# Patient Record
Sex: Female | Born: 1970 | Race: Black or African American | Hispanic: No | Marital: Single | Smoking: Current every day smoker
Health system: Southern US, Community
[De-identification: ages and names within clinical notes are randomized; demographics above are authoritative.]

## PROBLEM LIST (undated history)

## (undated) DIAGNOSIS — F329 Major depressive disorder, single episode, unspecified: Secondary | ICD-10-CM

## (undated) DIAGNOSIS — F32A Depression, unspecified: Secondary | ICD-10-CM

## (undated) DIAGNOSIS — R011 Cardiac murmur, unspecified: Secondary | ICD-10-CM

## (undated) DIAGNOSIS — J45909 Unspecified asthma, uncomplicated: Secondary | ICD-10-CM

## (undated) DIAGNOSIS — J449 Chronic obstructive pulmonary disease, unspecified: Secondary | ICD-10-CM

## (undated) HISTORY — DX: Depression, unspecified: F32.A

## (undated) HISTORY — PX: ABDOMINAL HYSTERECTOMY: SHX81

## (undated) HISTORY — DX: Major depressive disorder, single episode, unspecified: F32.9

---

## 2004-08-31 ENCOUNTER — Emergency Department: Payer: Self-pay | Admitting: Emergency Medicine

## 2005-07-08 ENCOUNTER — Emergency Department: Payer: Self-pay | Admitting: Emergency Medicine

## 2006-07-25 ENCOUNTER — Inpatient Hospital Stay: Payer: Self-pay | Admitting: Psychiatry

## 2006-10-02 ENCOUNTER — Ambulatory Visit: Payer: Self-pay | Admitting: Unknown Physician Specialty

## 2007-02-27 ENCOUNTER — Ambulatory Visit: Payer: Self-pay | Admitting: Unknown Physician Specialty

## 2007-03-28 ENCOUNTER — Ambulatory Visit: Payer: Self-pay | Admitting: Unknown Physician Specialty

## 2007-09-10 ENCOUNTER — Emergency Department: Payer: Self-pay | Admitting: Emergency Medicine

## 2007-12-03 ENCOUNTER — Emergency Department: Payer: Self-pay | Admitting: Emergency Medicine

## 2008-09-12 ENCOUNTER — Emergency Department: Payer: Self-pay | Admitting: Emergency Medicine

## 2009-10-28 ENCOUNTER — Emergency Department: Payer: Self-pay | Admitting: Emergency Medicine

## 2010-08-04 ENCOUNTER — Emergency Department: Payer: Self-pay | Admitting: Emergency Medicine

## 2010-09-04 ENCOUNTER — Emergency Department: Payer: Self-pay | Admitting: Unknown Physician Specialty

## 2011-01-18 ENCOUNTER — Ambulatory Visit: Payer: Self-pay | Admitting: Internal Medicine

## 2011-01-31 ENCOUNTER — Ambulatory Visit: Payer: Self-pay | Admitting: Internal Medicine

## 2012-03-21 ENCOUNTER — Ambulatory Visit: Payer: Self-pay | Admitting: Internal Medicine

## 2013-06-02 ENCOUNTER — Ambulatory Visit: Payer: Self-pay | Admitting: Physician Assistant

## 2014-04-02 ENCOUNTER — Ambulatory Visit: Payer: Self-pay

## 2014-08-12 ENCOUNTER — Ambulatory Visit: Payer: Self-pay | Admitting: Physician Assistant

## 2014-08-27 ENCOUNTER — Ambulatory Visit: Payer: Self-pay | Admitting: Internal Medicine

## 2016-03-14 ENCOUNTER — Encounter: Payer: Self-pay | Admitting: Emergency Medicine

## 2016-03-14 ENCOUNTER — Emergency Department
Admission: EM | Admit: 2016-03-14 | Discharge: 2016-03-14 | Disposition: A | Discharge status: Home / Self Care | Payer: Medicaid Other | Attending: Emergency Medicine | Admitting: Emergency Medicine

## 2016-03-14 DIAGNOSIS — I809 Phlebitis and thrombophlebitis of unspecified site: Secondary | ICD-10-CM | POA: Diagnosis not present

## 2016-03-14 DIAGNOSIS — I10 Essential (primary) hypertension: Secondary | ICD-10-CM | POA: Diagnosis present

## 2016-03-14 DIAGNOSIS — J449 Chronic obstructive pulmonary disease, unspecified: Secondary | ICD-10-CM | POA: Diagnosis not present

## 2016-03-14 DIAGNOSIS — J45909 Unspecified asthma, uncomplicated: Secondary | ICD-10-CM | POA: Insufficient documentation

## 2016-03-14 DIAGNOSIS — F17210 Nicotine dependence, cigarettes, uncomplicated: Secondary | ICD-10-CM | POA: Diagnosis not present

## 2016-03-14 DIAGNOSIS — Z711 Person with feared health complaint in whom no diagnosis is made: Secondary | ICD-10-CM

## 2016-03-14 HISTORY — DX: Chronic obstructive pulmonary disease, unspecified: J44.9

## 2016-03-14 HISTORY — DX: Unspecified asthma, uncomplicated: J45.909

## 2016-03-14 MED ORDER — NAPROXEN 500 MG PO TABS: 500.0000 mg | ORAL_TABLET | Freq: Two times a day (BID) | ORAL | Status: DC

## 2016-03-14 NOTE — Discharge Instructions (Signed)
Phlebitis Phlebitis is soreness and puffiness (swelling) in a vein.  HOME CARE  Only take medicine as told by your doctor.  Raise (elevate) the affected limb on a pillow as told by your doctor.  Keep a warm pack on the affected vein as told by your doctor. Do not sleep with a heating pad.  Use special stockings or bandages around the area of the affected vein as told by your doctor. These will speed healing and keep the condition from coming back.  Talk to your doctor about all the medicines you take.  Get follow-up blood tests as told by your doctor.  If the phlebitis is in your legs:  Avoid standing or resting for long periods.  Keep your legs moving. Raise your legs when you sit or lie.  Do not smoke.  Follow-up with your doctor as told. GET HELP IF:  You have strange bruises or bleeding.  Your puffiness or pain in the affected area is not getting better.  You are taking medicine to lessen puffiness (anti-inflammatory medicine), and you get belly pain.  You have a fever. GET HELP RIGHT AWAY IF:   The phlebitis gets worse and you have more pain, puffiness (swelling), or redness.  You have trouble breathing or have chest pain. MAKE SURE YOU:   Understand these instructions.  Will watch your condition.  Will get help right away if you are not doing well or get worse.   This information is not intended to replace advice given to you by your health care provider. Make sure you discuss any questions you have with your health care provider.   Document Released: 10/17/2009 Document Revised: 11/03/2013 Document Reviewed: 07/06/2013 Elsevier Interactive Patient Education 2016 [employer].   How to Take Your Blood Pressure  HOW DO I GET A BLOOD PRESSURE MACHINE?  You can buy an electronic home blood pressure machine at your local pharmacy. Insurance will sometimes cover the cost if you have a prescription.  Ask your doctor what type of machine is best for you. There  are different machines for your arm and your wrist.  If you decide to buy a machine to check your blood pressure on your arm, first check the size of your arm so you can buy the right size cuff. To check the size of your arm:  Use a measuring tape that shows both inches and centimeters.  Wrap the measuring tape around the upper-middle part of your arm. You may need someone to help you measure.  Write down your arm measurement in both inches and centimeters.  To measure your blood pressure correctly, it is important to have the right size cuff.  If your arm is up to 13 inches (up to 34 centimeters), get an adult cuff size.  If your arm is 13 to 17 inches (35 to 44 centimeters), get a large adult cuff size.    If your arm is 17 to 20 inches (45 to 52 centimeters), get an adult thigh cuff.  WHAT DO THE NUMBERS MEAN?  There are two numbers that make up your blood pressure. For example: 120/80.  The first number (120 in our example) is called the "systolic pressure." It is a measure of the pressure in your blood vessels when your heart is pumping blood.  The second number (80 in our example) is called the "diastolic pressure." It is a measure of the pressure in your blood vessels when your heart is resting between beats. Your doctor will tell you  what your blood pressure should be. WHAT SHOULD I DO BEFORE I CHECK MY BLOOD PRESSURE?  Try to rest or relax for at least 30 minutes before you check your blood pressure.  Do not smoke.  Do not have any drinks with caffeine, such as:  Soda.  Coffee.  Tea. Check your blood pressure in a quiet room.  Sit down and stretch out your arm on a table. Keep your arm at about the level of your heart. Let your arm relax.  Make sure that your legs are not crossed. HOW DO I CHECK MY BLOOD PRESSURE?  Follow the directions that came with your machine.  Make sure you remove any tight-fitting clothing from your arm or wrist. Wrap the cuff around your upper arm or  wrist. You should be able to fit a finger between the cuff and your arm. If you cannot fit a finger between the cuff and your arm, it is too tight and should be removed and rewrapped.  Some units require you to manually pump up the arm cuff.  Automatic units inflate the cuff when you press a button.  Cuff deflation is automatic in both models.  After the cuff is inflated, the unit measures your blood pressure and pulse. The readings are shown on a monitor. Hold still and breathe normally while the cuff is inflated.  Getting a reading takes less than a minute.  Some models store readings in a memory. Some provide a printout of readings. If your machine does not store your readings, keep a written record.  Take readings with you to your next visit with your doctor. This information is not intended to replace advice given to you by your health care provider. Make sure you discuss any questions you have with your health care provider.  Document Released: 10/11/2008 Document Revised: 11/19/2014 Document Reviewed: 12/24/2013  Elsevier Interactive Patient Education [employer]2016 Elsevier Inc.

## 2016-03-14 NOTE — ED Notes (Signed)
See triage note.  States she is worried about her b/p  Her friend tok her pressure and told her it was high  B/p on arrival to ED was normal  Denies any sx's

## 2016-03-14 NOTE — ED Notes (Signed)
Patient presents to the ED concerned about high blood pressure.  Patient states 2 weeks ago she went to the plasma center to sell plasma and her blood pressure was low, a week ago it was low again.  Today patient had her neighbor check her blood pressure and her neighbor told patient her blood pressure was, "a little above normal."  Patient states, "I don't know what's going on."  Patient denies any pain, blurred vision, dizziness or weakness.  Patient is alert and oriented x 4 at this time.

## 2016-03-14 NOTE — ED Provider Notes (Signed)
[facility] Emergency Department Provider Note  ____________________________________________  Time seen: Approximately 3:15 PM  I have reviewed the triage vital signs and the nursing notes.   HISTORY  Chief Complaint Hypertension    HPI Angel Juarez is a 45 y.o. female , NAD, presents to the emergency department to have her blood pressure checked. Patient states that she has been giving blood to the plasma center twice per week over the last 6 weeks. States that she was told that her blood pressure was low during one of her visits to the plasma center. States that she bought a manual blood pressure cuff and had a friend check her blood pressure but unfortunately they did not use a stethoscope to assess the pressure. Patient notes that her neighbor told her that her blood pressure was "a little above normal". Patient denies any chest pain, shortness of breath, cough, abdominal pain, nausea, vomiting, diaphoresis, visual changes, numbness, weakness, tingling. On beside note she relays that she has some mild pain in the right antecubital fossa that can radiate up the arm. Does not had any oozing or weeping from the site. No fevers, chills, body aches.   Past Medical History  Diagnosis Date  . Asthma   . COPD (chronic obstructive pulmonary disease) (HCC)     There are no active problems to display for this patient.   Past Surgical History  Procedure Laterality Date  . Abdominal hysterectomy      Current Outpatient Rx  Name  Route  Sig  Dispense  Refill  . naproxen (NAPROSYN) 500 MG tablet   Oral   Take 1 tablet (500 mg total) by mouth 2 (two) times daily with a meal.   14 tablet   0     Allergies Shellfish allergy and Amoxicillin  No family history on file.  Social History Social History  Substance Use Topics  . Smoking status: Current Every Day Smoker -- 0.50 packs/day    Types: Cigarettes  . Smokeless tobacco: None  . Alcohol Use: Yes   Comment: weekly     Review of Systems  Constitutional: No fever/chills, fatigue Eyes: No visual changes.  Cardiovascular: No chest pain, Palpitations. Respiratory: No cough. No shortness of breath. No wheezing.  Gastrointestinal: No abdominal pain.  No nausea, vomiting.  Musculoskeletal: Negative for back, neck pain.  Skin: Positive redness and swelling left antecubital fossa. Negative for rash. Neurological: Negative for headaches, focal weakness or numbness. No tingling. 10-point ROS otherwise negative.  ____________________________________________   PHYSICAL EXAM:  VITAL SIGNS: ED Triage Vitals  Enc Vitals Group     BP 03/14/16 1452 103/67 mmHg     Pulse Rate 03/14/16 1452 88     Resp 03/14/16 1452 18     Temp 03/14/16 1452 98.2 F (36.8 C)     Temp Source 03/14/16 1452 Oral     SpO2 03/14/16 1452 98 %     Weight 03/14/16 1452 123 lb (55.792 kg)     Height 03/14/16 1452  (1.6 m)     Head Cir --      Peak Flow --      Pain Score 03/14/16 1453 0     Pain Loc --      Pain Edu? --      Excl. in GC? --      Constitutional: Alert and oriented. Well appearing and in no acute distress. Eyes: Conjunctivae are normal. PERRL. EOMI without pain.  Head: Atraumatic. Neck: Supple with full  range of motion Hematological/Lymphatic/Immunilogical: No cervical lymphadenopathy. Cardiovascular: Normal rate, regular rhythm. Normal S1 and S2.  Good peripheral circulation with 2+ pulses noted in the bilateral upper extremities. Capillary refill less than 3 seconds in the right fingers. Respiratory: Normal respiratory effort without tachypnea or retractions. Lungs CTAB with breath sounds noted in all lung fields. Musculoskeletal: Full range of motion of the right shoulder, elbow, wrist, hand, fingers without pain. No lower extremity tenderness nor edema.  No joint effusions. Neurologic:  Normal speech and language. No gross focal neurologic deficits are appreciated.  Skin:  Single  puncture wound with surrounding ecchymosis noted about the right antecubital fossa with evidence of erythema, warmth following a blood vessel of the right upper arm. No area of induration or fluctuance is noted. No oozing or weeping of any site. Skin is warm, dry and intact. No rash noted. Psychiatric: Mood and affect are normal. Speech and behavior are normal. Patient exhibits appropriate insight and judgement.   ____________________________________________   LABS  None ____________________________________________  EKG  None ____________________________________________  RADIOLOGY  None ____________________________________________    PROCEDURES  Procedure(s) performed: None    Medications - No data to display   ____________________________________________   INITIAL IMPRESSION / ASSESSMENT AND PLAN / ED COURSE  Patient's diagnosis is consistent with phlebitis and person with her complaint without diagnosis. Patient will be discharged home with prescriptions for naproxen to take as directed. Patient is to apply warm heat per exit care instructions given to treat the phlebitis. Patient is advised that she should not give plasma for at least 2 weeks to allow her body to heal. Patient may follow up with her primary care provider for repeat blood pressure check as needed. Patient also advised that she can go to any local pharmacy or [facility] to have her blood pressure recorded by a medical professional. Patient was reassured at this time blood pressure reading along with all other vital signs were within normal limits. Patient is given ED precautions to return to the ED for any worsening or new symptoms.    ____________________________________________  FINAL CLINICAL IMPRESSION(S) / ED DIAGNOSES  Final diagnoses:  Phlebitis  Person with feared complaint in whom no diagnosis was made      NEW MEDICATIONS STARTED DURING THIS VISIT:  Discharge Medication List as of  03/14/2016  3:55 PM    START taking these medications   Details  naproxen (NAPROSYN) 500 MG tablet Take 1 tablet (500 mg total) by mouth 2 (two) times daily with a meal., Starting 03/14/2016, Until Discontinued, Print             Hope PigeonJami L Keishawn Rajewski, PA-C 03/14/16 1630  Phineas SemenGraydon Goodman, MD 03/14/16 1721

## 2017-05-29 ENCOUNTER — Other Ambulatory Visit: Payer: Self-pay | Admitting: Internal Medicine

## 2017-05-29 DIAGNOSIS — R0602 Shortness of breath: Secondary | ICD-10-CM

## 2017-11-11 ENCOUNTER — Emergency Department: Payer: Medicaid Other

## 2017-11-11 ENCOUNTER — Encounter: Payer: Self-pay | Admitting: Emergency Medicine

## 2017-11-11 ENCOUNTER — Emergency Department
Admission: EM | Admit: 2017-11-11 | Discharge: 2017-11-11 | Disposition: A | Discharge status: Home / Self Care | Payer: Medicaid Other | Attending: Emergency Medicine | Admitting: Emergency Medicine

## 2017-11-11 DIAGNOSIS — J449 Chronic obstructive pulmonary disease, unspecified: Secondary | ICD-10-CM | POA: Diagnosis not present

## 2017-11-11 DIAGNOSIS — F17210 Nicotine dependence, cigarettes, uncomplicated: Secondary | ICD-10-CM | POA: Insufficient documentation

## 2017-11-11 DIAGNOSIS — R112 Nausea with vomiting, unspecified: Secondary | ICD-10-CM | POA: Diagnosis not present

## 2017-11-11 DIAGNOSIS — J45909 Unspecified asthma, uncomplicated: Secondary | ICD-10-CM | POA: Diagnosis not present

## 2017-11-11 DIAGNOSIS — Z79899 Other long term (current) drug therapy: Secondary | ICD-10-CM | POA: Diagnosis not present

## 2017-11-11 DIAGNOSIS — R1110 Vomiting, unspecified: Secondary | ICD-10-CM | POA: Diagnosis present

## 2017-11-11 LAB — CBC
HEMATOCRIT: 40 % (ref 35.0–47.0)
Hemoglobin: 13.5 g/dL (ref 12.0–16.0)
MCH: 32.8 pg (ref 26.0–34.0)
MCHC: 33.8 g/dL (ref 32.0–36.0)
MCV: 97 fL (ref 80.0–100.0)
Platelets: 203 10*3/uL (ref 150–440)
RBC: 4.12 MIL/uL (ref 3.80–5.20)
RDW: 11.9 % (ref 11.5–14.5)
WBC: 7.3 10*3/uL (ref 3.6–11.0)

## 2017-11-11 LAB — URINALYSIS, COMPLETE (UACMP) WITH MICROSCOPIC
BACTERIA UA: NONE SEEN
BILIRUBIN URINE: NEGATIVE
Glucose, UA: NEGATIVE mg/dL
Hgb urine dipstick: NEGATIVE
KETONES UR: NEGATIVE mg/dL
LEUKOCYTES UA: NEGATIVE
Nitrite: NEGATIVE
PROTEIN: NEGATIVE mg/dL
Specific Gravity, Urine: 1.024 (ref 1.005–1.030)
pH: 6 (ref 5.0–8.0)

## 2017-11-11 LAB — COMPREHENSIVE METABOLIC PANEL
ALT: 16 U/L (ref 14–54)
AST: 17 U/L (ref 15–41)
Albumin: 4.1 g/dL (ref 3.5–5.0)
Alkaline Phosphatase: 65 U/L (ref 38–126)
Anion gap: 7 (ref 5–15)
BUN: 9 mg/dL (ref 6–20)
CHLORIDE: 105 mmol/L (ref 101–111)
CO2: 28 mmol/L (ref 22–32)
Calcium: 8.8 mg/dL — ABNORMAL LOW (ref 8.9–10.3)
Creatinine, Ser: 0.78 mg/dL (ref 0.44–1.00)
Glucose, Bld: 97 mg/dL (ref 65–99)
POTASSIUM: 3.4 mmol/L — AB (ref 3.5–5.1)
SODIUM: 140 mmol/L (ref 135–145)
Total Bilirubin: 0.7 mg/dL (ref 0.3–1.2)
Total Protein: 6.6 g/dL (ref 6.5–8.1)

## 2017-11-11 LAB — LIPASE, BLOOD: LIPASE: 41 U/L (ref 11–51)

## 2017-11-11 MED ORDER — ONDANSETRON HCL 4 MG PO TABS
4.0000 mg | ORAL_TABLET | Freq: Once | ORAL | Status: AC
  Administered 2017-11-11: 4 mg via ORAL
  Filled 2017-11-11: qty 1

## 2017-11-11 MED ORDER — ACETAMINOPHEN 325 MG PO TABS
650.0000 mg | ORAL_TABLET | Freq: Once | ORAL | Status: AC
  Administered 2017-11-11: 650 mg via ORAL
  Filled 2017-11-11: qty 2

## 2017-11-11 MED ORDER — ONDANSETRON HCL 4 MG PO TABS: 4.0000 mg | ORAL_TABLET | Freq: Every day | ORAL | 0 refills | Status: DC | PRN

## 2017-11-11 MED ORDER — ONDANSETRON HCL 4 MG PO TABS: 4.0000 mg | ORAL_TABLET | Freq: Every day | ORAL | 0 refills | Status: AC | PRN

## 2017-11-11 NOTE — ED Provider Notes (Signed)
[facility] Emergency Department Provider Note  ____________________________________________  Time seen: Approximately 10:07 PM  I have reviewed the triage vital signs and the nursing notes.   HISTORY  Chief Complaint Emesis    HPI Angel Juarez is a 46 y.o. female that presents to the emergency department for evaluation of vomiting for 4 days.  Patient states that she is able to drink liquids but is unable to keep down any solids. She is having some overall abdominal discomfort and low back pain.  She did eat a subway sandwhich on Saturday and chicken noodle soup today. She states that she made a homemade alcoholic punch on Wednesday with old fruit and has not been able to eat since.  Last bowel movement was yesterday and was normal.  Patient has sexual intercourse with one person and they use protection.  Patient had a hysterectomy greater than 10 years ago.  She denies nausea, dysuria, urgency, frequency, diarrhea, constipation, vaginal discharge.  Past Medical History:  Diagnosis Date  . Asthma   . COPD (chronic obstructive pulmonary disease) (HCC)     There are no active problems to display for this patient.   Past Surgical History:  Procedure Laterality Date  . ABDOMINAL HYSTERECTOMY      Prior to Admission medications   Medication Sig Start Date End Date Taking? Authorizing Provider  naproxen (NAPROSYN) 500 MG tablet Take 1 tablet (500 mg total) by mouth 2 (two) times daily with a meal. 03/14/16   Hagler, Jami L, PA-C  ondansetron (ZOFRAN) 4 MG tablet Take 1 tablet (4 mg total) by mouth daily as needed for nausea or vomiting. 11/11/17 11/11/18  Enid DerryWagner, Donovin Kraemer, PA-C    Allergies Shellfish allergy and Amoxicillin  History reviewed. No pertinent family history.  Social History Social History   Tobacco Use  . Smoking status: Current Every Day Smoker    Packs/day: 0.50    Types: Cigarettes  . Smokeless tobacco: Never Used  Substance Use Topics   . Alcohol use: Yes    Comment: weekly  . Drug use: Not on file     Review of Systems  Constitutional: No fever/chills Cardiovascular: No chest pain. Respiratory: No SOB. Genitourinary: Negative for dysuria. Musculoskeletal: Positive for back pain. Skin: Negative for rash, abrasions, lacerations, ecchymosis.   ____________________________________________   PHYSICAL EXAM:  VITAL SIGNS: ED Triage Vitals  Enc Vitals Group     BP 11/11/17 1917 99/70     Pulse Rate 11/11/17 1917 69     Resp 11/11/17 1917 16     Temp 11/11/17 1917 98.2 F (36.8 C)     Temp Source 11/11/17 1917 Oral     SpO2 11/11/17 1917 98 %     Weight 11/11/17 1916 123 lb (55.8 kg)     Height --      Head Circumference --      Peak Flow --      Pain Score 11/11/17 2111 7     Pain Loc --      Pain Edu? --      Excl. in GC? --      Constitutional: Alert and oriented. Well appearing and in no acute distress. Eyes: Conjunctivae are normal. PERRL. EOMI. Head: Atraumatic. ENT:      Ears:      Nose: No congestion/rhinnorhea.      Mouth/Throat: Mucous membranes are moist.  Neck: No stridor.   Cardiovascular: Normal rate, regular rhythm.  Good peripheral circulation. Respiratory: Normal respiratory effort without tachypnea  or retractions. Lungs CTAB. Good air entry to the bases with no decreased or absent breath sounds. Gastrointestinal: Bowel sounds 4 quadrants. Soft and nontender to palpation. No guarding or rigidity. No palpable masses. No distention.  Musculoskeletal: Full range of motion to all extremities. No gross deformities appreciated. Neurologic:  Normal speech and language. No gross focal neurologic deficits are appreciated.  Skin:  Skin is warm, dry and intact. No rash noted.   ____________________________________________   LABS (all labs ordered are listed, but only abnormal results are displayed)  Labs Reviewed  COMPREHENSIVE METABOLIC PANEL - Abnormal; Notable for the following  components:      Result Value   Potassium 3.4 (*)    Calcium 8.8 (*)    All other components within normal limits  URINALYSIS, COMPLETE (UACMP) WITH MICROSCOPIC - Abnormal; Notable for the following components:   Color, Urine YELLOW (*)    APPearance CLEAR (*)    Squamous Epithelial / LPF 0-5 (*)    All other components within normal limits  LIPASE, BLOOD  CBC   ____________________________________________  EKG   ____________________________________________  RADIOLOGY Lexine Baton, personally viewed and evaluated these images (plain radiographs) as part of my medical decision making, as well as reviewing the written report by the radiologist.  Dg Abdomen 1 View  Result Date: 11/11/2017 CLINICAL DATA:  Acute onset of vomiting. EXAM: ABDOMEN - 1 VIEW COMPARISON:  CT of the abdomen and pelvis from 12/04/2007 FINDINGS: The visualized bowel gas pattern is unremarkable. Scattered air and stool filled loops of colon are seen; no abnormal dilatation of small bowel loops is seen to suggest small bowel obstruction. No free intra-abdominal air is identified, though evaluation for free air is limited on a single supine view. The visualized osseous structures are within normal limits; the sacroiliac joints are unremarkable in appearance. IMPRESSION: Unremarkable bowel gas pattern; no free intra-abdominal air seen. Small to moderate amount of stool noted in the colon. Electronically Signed   By: Roanna Raider M.D.   On: 11/11/2017 22:25    ____________________________________________    PROCEDURES  Procedure(s) performed:    Procedures    Medications  ondansetron [employer]) tablet 4 mg (4 mg Oral Given 11/11/17 2201)  acetaminophen (TYLENOL) tablet 650 mg (650 mg Oral Given 11/11/17 2310)     ____________________________________________   INITIAL IMPRESSION / ASSESSMENT AND PLAN / ED COURSE  Pertinent labs & imaging results that were available during my care of the patient  were reviewed by me and considered in my medical decision making (see chart for details).  Review of the [place] CSRS was performed in accordance of the [facility] prior to dispensing any controlled drugs.   Patient presented to the emergency department for evaluation of vomiting for 4 days.  Vital signs, labwork and exam are reassuring.  X-ray was ordered to evaluate for obstruction or volvulus and negative for acute abnormalities.  Patient is eating graham crackers with peanut butter and drinking ginger ale in room without difficulty.  Patient will be discharged home with prescriptions for Zofran. Patient is to follow up with PCP as directed. Patient is given ED precautions to return to the ED for any worsening or new symptoms.     ____________________________________________  FINAL CLINICAL IMPRESSION(S) / ED DIAGNOSES  Final diagnoses:  Non-intractable vomiting with nausea, unspecified vomiting type      NEW MEDICATIONS STARTED DURING THIS VISIT:  ED Discharge Orders        Ordered    ondansetron (ZOFRAN) 4  MG tablet  Daily PRN,   Status:  Discontinued     11/11/17 2307    ondansetron (ZOFRAN) 4 MG tablet  Daily PRN,   Status:  Discontinued     11/11/17 2308    ondansetron (ZOFRAN) 4 MG tablet  Daily PRN     11/11/17 2315          This chart was dictated using voice recognition software/Dragon. Despite best efforts to proofread, errors can occur which can change the meaning. Any change was purely unintentional.    Enid DerryWagner, Keeley Sussman, PA-C 11/11/17 [id number]    Merrily Brittleifenbark, Neil, MD 11/11/17 [phone]

## 2017-11-11 NOTE — ED Triage Notes (Addendum)
Pt arrived via EMS from home. Pt reports she has not been able to keep anything solid down since last Thursday. Pt sts the emesis is white in color and occur res when she try's to eat. Pt denies abdominal pain, fever and urinary symptoms and diarrhea.

## 2017-11-21 ENCOUNTER — Encounter: Payer: Self-pay | Admitting: Nurse Practitioner

## 2017-11-21 ENCOUNTER — Ambulatory Visit: Payer: Medicaid Other | Admitting: Nurse Practitioner

## 2017-11-21 VITALS — BP 96/53 | HR 72 | Resp 16 | Ht 64.0 in | Wt 114.0 lb

## 2017-11-21 DIAGNOSIS — J449 Chronic obstructive pulmonary disease, unspecified: Secondary | ICD-10-CM | POA: Diagnosis not present

## 2017-11-21 DIAGNOSIS — M25561 Pain in right knee: Secondary | ICD-10-CM | POA: Diagnosis not present

## 2017-11-21 DIAGNOSIS — F329 Major depressive disorder, single episode, unspecified: Secondary | ICD-10-CM

## 2017-11-21 DIAGNOSIS — R1114 Bilious vomiting: Secondary | ICD-10-CM | POA: Diagnosis not present

## 2017-11-21 DIAGNOSIS — F32A Depression, unspecified: Secondary | ICD-10-CM | POA: Insufficient documentation

## 2017-11-21 DIAGNOSIS — F17200 Nicotine dependence, unspecified, uncomplicated: Secondary | ICD-10-CM | POA: Diagnosis not present

## 2017-11-21 MED ORDER — NAPROXEN 500 MG PO TABS: 500.0000 mg | ORAL_TABLET | Freq: Two times a day (BID) | ORAL | 1 refills | Status: DC

## 2017-11-21 NOTE — Progress Notes (Signed)
[facility] [address] [place], [place] [address]  Internal MEDICINE  Office Visit Note  Patient Name: Angel Juarez  [id number]  [id number]  Date of Service: 11/21/2017     Complaints/HPI Pt is here for routine follow up.  The patient is here for routine follow up exam. She was seen in ER on 11/11/2017 due to intractable vomiting. X-ray of the abdomen showed moderate amount of stool in the colon. She was given rx for zofran 4mg  when needed. States that her stomach only gets upset, now, when she eats solid food. zofran helps. Has been gradually getting better.  Has had some problems with pain in her knee. States that she has rheumatoid arthritis. Has been taking hydrocodone/APAP 7.5/325mg  tablets when needed for pain. This was left over from oral surgery she had a while back. This has helped her pain.  She also is complaining of very dry skin, making her itch very bad. recenlty started using an exfoliating body wash and putting moistruizing oil on her skin. This is helping.     Current Medication: Outpatient Encounter Medications as of 11/21/2017  Medication Sig  . albuterol (PROVENTIL HFA;VENTOLIN HFA) 108 (90 Base) MCG/ACT inhaler Inhale 2 puffs into the lungs every 6 (six) hours as needed for wheezing or shortness of breath.  . Fluticasone-Salmeterol (ADVAIR) 250-50 MCG/DOSE AEPB Inhale 1 puff into the lungs 2 (two) times daily.  . sertraline (ZOLOFT) 25 MG tablet Take 25 mg by mouth daily.  . Tiotropium Bromide Monohydrate (SPIRIVA RESPIMAT) 1.25 MCG/ACT AERS Inhale into the lungs daily. One puff daily  . naproxen (NAPROSYN) 500 MG tablet Take 1 tablet (500 mg total) by mouth 2 (two) times daily with a meal.  . ondansetron (ZOFRAN) 4 MG tablet Take 1 tablet (4 mg total) by mouth daily as needed for nausea or vomiting. (Patient not taking: Reported on 11/21/2017)  . [DISCONTINUED] Fluticasone-Salmeterol (ADVAIR) 100-50 MCG/DOSE AEPB Inhale 1 puff into the lungs 2 (two)  times daily.  . [DISCONTINUED] naproxen (NAPROSYN) 500 MG tablet Take 1 tablet (500 mg total) by mouth 2 (two) times daily with a meal. (Patient not taking: Reported on 11/21/2017)   No facility-administered encounter medications on file as of 11/21/2017.     Surgical History: Past Surgical History:  Procedure Laterality Date  . ABDOMINAL HYSTERECTOMY      Medical History: Past Medical History:  Diagnosis Date  . Asthma   . COPD (chronic obstructive pulmonary disease) (HCC)   . Depression     Family History: No family history on file.  Social History   Socioeconomic History  . Marital status: Single    Spouse name: Not on file  . Number of children: Not on file  . Years of education: Not on file  . Highest education level: Not on file  Social Needs  . Financial resource strain: Not on file  . Food insecurity - worry: Not on file  . Food insecurity - inability: Not on file  . Transportation needs - medical: Not on file  . Transportation needs - non-medical: Not on file  Occupational History  . Not on file  Tobacco Use  . Smoking status: Current Every Day Smoker    Packs/day: 0.50    Types: Cigarettes  . Smokeless tobacco: Never Used  Substance and Sexual Activity  . Alcohol use: Yes    Comment: weekly  . Drug use: No  . Sexual activity: No  Other Topics Concern  . Not on file  Social  History Narrative  . Not on file      Review of Systems  Constitutional: Positive for appetite change. Negative for fatigue.  HENT: Negative.   Eyes: Negative.   Respiratory: Negative for cough, chest tightness and wheezing.   Cardiovascular: Negative for chest pain and palpitations.  Gastrointestinal: Positive for nausea and vomiting.  Endocrine: Negative.   Genitourinary: Negative.   Musculoskeletal: Positive for arthralgias and myalgias.  Skin:       Dry skin with itching and flaking.  Allergic/Immunologic: Negative.   Neurological: Negative.   Hematological:  Negative.   Psychiatric/Behavioral: The patient is nervous/anxious.     Today's Vitals   11/21/17 1513  BP: (!) 96/53  Pulse: 72  Resp: 16  SpO2: 99%  Weight: 114 lb (51.7 kg)  Height: 5\' 4"  (1.626 m)    Physical Exam  Constitutional: She is oriented to person, place, and time. She appears well-developed and well-nourished.  HENT:  Head: Normocephalic.  Eyes: Pupils are equal, round, and reactive to light.  Neck: Normal range of motion. Neck supple. No JVD present. No thyromegaly present.  Cardiovascular: Normal rate, regular rhythm and normal heart sounds.  Pulmonary/Chest: No accessory muscle usage. No respiratory distress. She has no wheezes. She has rhonchi in the right upper field, the right middle field, the left upper field and the left middle field. She exhibits no tenderness.  Abdominal: Soft. Bowel sounds are normal. There is no tenderness.  Musculoskeletal:       Legs: Neurological: She is alert and oriented to person, place, and time.  Skin: Skin is warm and dry.  Psychiatric: Her speech is normal. Judgment and thought content normal. Her mood appears anxious. She is agitated. Cognition and memory are normal.  Nursing note and vitals reviewed.    Assessment/Plan:   ICD-10-CM   1. Bilious vomiting with nausea R11.14   2. Chronic obstructive pulmonary disease, unspecified COPD type (HCC) J44.9   3. Depression, unspecified depression type F32.9   4. Nicotine dependence with current use F17.200   5. Pain in joint of right knee M25.561 naproxen (NAPROSYN) 500 MG tablet   1. Nausea/vomiting improving. Use zofran as needed and as prescribed. The 'BRAT' diet is suggested, then progress to diet as tolerated as symptoms abate. Call if bloody stools, persistent diarrhea, vomiting, fever or abdominal pain. 2. She should use rescue inhaler as needed and as prescribed. Advised patient she should stopped smoking.  3. Continue sertraline as prescribed  4. Discussion about  smoking and admivsed her she should stop 5. Naproxen 500mg  twice daily if needed for pain. Apply a compressive ACE bandage. Rest and elevate the affected painful area.  Apply cold compresses intermittently as needed.  As pain recedes, begin normal activities slowly as tolerated.  Call if symptoms persist.   General Counseling: I have discussed the findings of the evaluation and examination with Misty StanleyLisa.  I have also discussed any further diagnostic evaluation that may be needed or ordered today. Misty StanleyLisa verbalizes understanding of the findings of todays visit. We also reviewed her medications today. she has been encouraged to call the office with any questions or concerns that should arise related to todays visit.  This patient was seen by Vincent GrosHeather Ricarda Atayde, FNP- C in Collaboration with Dr Lyndon CodeFozia M Khan as a part of collaborative care agreement    Time spent:20 minutes.    Dr Lyndon CodeFozia M Khan Internal medicine

## 2017-11-22 ENCOUNTER — Other Ambulatory Visit: Payer: Self-pay

## 2017-11-22 DIAGNOSIS — M25561 Pain in right knee: Secondary | ICD-10-CM

## 2017-11-22 MED ORDER — NAPROXEN 500 MG PO TABS: 500.0000 mg | ORAL_TABLET | Freq: Two times a day (BID) | ORAL | 1 refills | Status: DC

## 2018-01-10 ENCOUNTER — Other Ambulatory Visit: Payer: Self-pay

## 2018-01-10 ENCOUNTER — Emergency Department: Payer: Medicaid Other

## 2018-01-10 ENCOUNTER — Emergency Department
Admission: EM | Admit: 2018-01-10 | Discharge: 2018-01-10 | Disposition: A | Discharge status: Home / Self Care | Payer: Medicaid Other | Attending: Emergency Medicine | Admitting: Emergency Medicine

## 2018-01-10 DIAGNOSIS — Y9389 Activity, other specified: Secondary | ICD-10-CM | POA: Diagnosis not present

## 2018-01-10 DIAGNOSIS — S161XXA Strain of muscle, fascia and tendon at neck level, initial encounter: Secondary | ICD-10-CM | POA: Diagnosis not present

## 2018-01-10 DIAGNOSIS — F17210 Nicotine dependence, cigarettes, uncomplicated: Secondary | ICD-10-CM | POA: Insufficient documentation

## 2018-01-10 DIAGNOSIS — R0602 Shortness of breath: Secondary | ICD-10-CM

## 2018-01-10 DIAGNOSIS — J449 Chronic obstructive pulmonary disease, unspecified: Secondary | ICD-10-CM | POA: Diagnosis not present

## 2018-01-10 DIAGNOSIS — S0990XA Unspecified injury of head, initial encounter: Secondary | ICD-10-CM | POA: Diagnosis present

## 2018-01-10 DIAGNOSIS — Y92410 Unspecified street and highway as the place of occurrence of the external cause: Secondary | ICD-10-CM | POA: Diagnosis not present

## 2018-01-10 DIAGNOSIS — Y999 Unspecified external cause status: Secondary | ICD-10-CM | POA: Insufficient documentation

## 2018-01-10 DIAGNOSIS — M7918 Myalgia, other site: Secondary | ICD-10-CM

## 2018-01-10 DIAGNOSIS — J45909 Unspecified asthma, uncomplicated: Secondary | ICD-10-CM | POA: Diagnosis not present

## 2018-01-10 LAB — CBC WITH DIFFERENTIAL/PLATELET
BASOS ABS: 0.1 10*3/uL (ref 0–0.1)
Basophils Relative: 1 %
EOS ABS: 0.1 10*3/uL (ref 0–0.7)
EOS PCT: 1 %
HCT: 38.7 % (ref 35.0–47.0)
Hemoglobin: 12.8 g/dL (ref 12.0–16.0)
Lymphocytes Relative: 43 %
Lymphs Abs: 3.3 10*3/uL (ref 1.0–3.6)
MCH: 32.6 pg (ref 26.0–34.0)
MCHC: 33.2 g/dL (ref 32.0–36.0)
MCV: 98.3 fL (ref 80.0–100.0)
MONO ABS: 0.4 10*3/uL (ref 0.2–0.9)
Monocytes Relative: 6 %
Neutro Abs: 3.7 10*3/uL (ref 1.4–6.5)
Neutrophils Relative %: 49 %
PLATELETS: 208 10*3/uL (ref 150–440)
RBC: 3.94 MIL/uL (ref 3.80–5.20)
RDW: 12.8 % (ref 11.5–14.5)
WBC: 7.6 10*3/uL (ref 3.6–11.0)

## 2018-01-10 LAB — COMPREHENSIVE METABOLIC PANEL
ALT: 18 U/L (ref 14–54)
AST: 24 U/L (ref 15–41)
Albumin: 4.1 g/dL (ref 3.5–5.0)
Alkaline Phosphatase: 67 U/L (ref 38–126)
Anion gap: 8 (ref 5–15)
BUN: 10 mg/dL (ref 6–20)
CHLORIDE: 107 mmol/L (ref 101–111)
CO2: 24 mmol/L (ref 22–32)
Calcium: 8.7 mg/dL — ABNORMAL LOW (ref 8.9–10.3)
Creatinine, Ser: 0.67 mg/dL (ref 0.44–1.00)
GFR calc Af Amer: >60 mL/min (ref >60)
Glucose, Bld: 101 mg/dL — ABNORMAL HIGH (ref 65–99)
POTASSIUM: 3.3 mmol/L — AB (ref 3.5–5.1)
SODIUM: 139 mmol/L (ref 135–145)
Total Bilirubin: 0.6 mg/dL (ref 0.3–1.2)
Total Protein: 7 g/dL (ref 6.5–8.1)

## 2018-01-10 LAB — ETHANOL

## 2018-01-10 MED ORDER — OXYCODONE-ACETAMINOPHEN 5-325 MG PO TABS
2.0000 | ORAL_TABLET | Freq: Once | ORAL | Status: AC
  Administered 2018-01-10: 2 via ORAL
  Filled 2018-01-10: qty 2

## 2018-01-10 MED ORDER — IBUPROFEN 800 MG PO TABS: 800.0000 mg | ORAL_TABLET | Freq: Three times a day (TID) | ORAL | 0 refills | Status: DC | PRN

## 2018-01-10 MED ORDER — KETOROLAC TROMETHAMINE 30 MG/ML IJ SOLN
30.0000 mg | Freq: Once | INTRAMUSCULAR | Status: AC
  Administered 2018-01-10: 30 mg via INTRAVENOUS
  Filled 2018-01-10: qty 1

## 2018-01-10 MED ORDER — DIAZEPAM 5 MG PO TABS: 5.0000 mg | ORAL_TABLET | Freq: Three times a day (TID) | ORAL | 0 refills | Status: DC | PRN

## 2018-01-10 MED ORDER — MECLIZINE HCL 25 MG PO TABS
50.0000 mg | ORAL_TABLET | Freq: Once | ORAL | Status: AC
  Administered 2018-01-10: 50 mg via ORAL
  Filled 2018-01-10: qty 2
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
## 2018-01-13 ENCOUNTER — Emergency Department
Admission: EM | Admit: 2018-01-13 | Discharge: 2018-01-13 | Disposition: A | Discharge status: Home / Self Care | Payer: Medicaid Other | Attending: Emergency Medicine | Admitting: Emergency Medicine

## 2018-01-13 ENCOUNTER — Telehealth: Payer: Self-pay

## 2018-01-13 ENCOUNTER — Emergency Department: Payer: Medicaid Other

## 2018-01-13 ENCOUNTER — Other Ambulatory Visit: Payer: Self-pay

## 2018-01-13 ENCOUNTER — Encounter: Payer: Self-pay | Admitting: Emergency Medicine

## 2018-01-13 DIAGNOSIS — J449 Chronic obstructive pulmonary disease, unspecified: Secondary | ICD-10-CM | POA: Diagnosis not present

## 2018-01-13 DIAGNOSIS — Y939 Activity, unspecified: Secondary | ICD-10-CM | POA: Insufficient documentation

## 2018-01-13 DIAGNOSIS — F17210 Nicotine dependence, cigarettes, uncomplicated: Secondary | ICD-10-CM | POA: Insufficient documentation

## 2018-01-13 DIAGNOSIS — S300XXA Contusion of lower back and pelvis, initial encounter: Secondary | ICD-10-CM

## 2018-01-13 DIAGNOSIS — Y929 Unspecified place or not applicable: Secondary | ICD-10-CM | POA: Diagnosis not present

## 2018-01-13 DIAGNOSIS — Z79899 Other long term (current) drug therapy: Secondary | ICD-10-CM | POA: Insufficient documentation

## 2018-01-13 DIAGNOSIS — S3983XA Other specified injuries of pelvis, initial encounter: Secondary | ICD-10-CM | POA: Diagnosis present

## 2018-01-13 DIAGNOSIS — Y999 Unspecified external cause status: Secondary | ICD-10-CM | POA: Insufficient documentation
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
## 2018-03-12 ENCOUNTER — Other Ambulatory Visit: Payer: Self-pay | Admitting: Nurse Practitioner

## 2018-03-12 DIAGNOSIS — Z1239 Encounter for other screening for malignant neoplasm of breast: Secondary | ICD-10-CM

## 2018-03-21 ENCOUNTER — Encounter: Payer: Self-pay | Admitting: Nurse Practitioner

## 2018-04-01 ENCOUNTER — Ambulatory Visit
Admission: RE | Admit: 2018-04-01 | Discharge: 2018-04-01 | Disposition: A | Discharge status: Home / Self Care | Payer: Medicaid Other | Source: Ambulatory Visit | Attending: Nurse Practitioner | Admitting: Nurse Practitioner

## 2018-04-01 DIAGNOSIS — Z1239 Encounter for other screening for malignant neoplasm of breast: Secondary | ICD-10-CM | POA: Insufficient documentation

## 2019-05-11 ENCOUNTER — Other Ambulatory Visit: Payer: Self-pay | Admitting: Family Medicine

## 2019-05-11 DIAGNOSIS — Z1231 Encounter for screening mammogram for malignant neoplasm of breast: Secondary | ICD-10-CM

## 2019-06-18 ENCOUNTER — Other Ambulatory Visit: Payer: Self-pay | Admitting: Family Medicine

## 2019-06-18 DIAGNOSIS — Z1231 Encounter for screening mammogram for malignant neoplasm of breast: Secondary | ICD-10-CM

## 2020-02-09 ENCOUNTER — Ambulatory Visit (INDEPENDENT_AMBULATORY_CARE_PROVIDER_SITE_OTHER): Payer: Medicaid Other | Admitting: Podiatry

## 2020-02-09 ENCOUNTER — Other Ambulatory Visit: Payer: Self-pay

## 2020-02-09 DIAGNOSIS — M79676 Pain in unspecified toe(s): Secondary | ICD-10-CM

## 2020-02-09 DIAGNOSIS — B351 Tinea unguium: Secondary | ICD-10-CM | POA: Diagnosis not present

## 2020-02-09 DIAGNOSIS — L603 Nail dystrophy: Secondary | ICD-10-CM

## 2020-02-11 NOTE — Progress Notes (Signed)
   Subjective: 49 y.o. female presenting today as a new patient with a chief complaint of throbbing pain to the left great toenail that began about one month ago. She states the nail is cracked. Walking and applying pressure to the nail increases the pain. She has been soaking the toe in Epsom salt and trimming it herself for treatment. Patient is here for further evaluation and treatment.   Past Medical History:  Diagnosis Date  . Asthma   . COPD (chronic obstructive pulmonary disease) (HCC)   . Depression     Objective:  General: Well developed, nourished, in no acute distress, alert and oriented x3   Dermatology: Hyperkeratotic, discolored, thickened, onychodystrophy of the left great toenail. Nails are tender, long, thickened and dystrophic with subungual debris, consistent with onychomycosis, 1-5 bilateral. Skin is warm, dry and supple bilateral lower extremities. Negative for open lesions or macerations.  Vascular: Dorsalis Pedis artery and Posterior Tibial artery pedal pulses palpable. No lower extremity edema noted.   Neruologic: Grossly intact via light touch bilateral.  Musculoskeletal: Muscular strength within normal limits in all groups bilateral. Normal range of motion noted to all pedal and ankle joints.   Assessment:  #1 Dystrophic nail of the left hallux #2 Onychodystrophic nails 1-5 bilaterally with hyperkeratosis of nails.  #3 Onychomycosis of nail due to dermatophyte bilateral   Plan of Care:  1. Patient evaluated.  2. Discussed treatment alternatives and plan of care. Explained nail avulsion procedure and post procedure course to patient. 3. Patient opted for total temporary nail avulsion of the left great toenail.  4. Prior to procedure, local anesthesia infiltration utilized using 3 ml of a 50:50 mixture of 2% plain lidocaine and 0.5% plain marcaine in a normal hallux block fashion and a betadine prep performed.  5. Light dressing applied. 6. Mechanical  debridement of nails 1-5 right, 2-5 left performed using a nail nipper. Filed with dremel without incident.  7. Return to clinic as needed.   Felecia Shelling, DPM [facility]  Dr. Felecia Shelling, DPM    [address]                                        [place], [place] [address]                Office [phone]  Fax [phone]

## 2020-03-24 ENCOUNTER — Other Ambulatory Visit: Payer: Self-pay | Admitting: Family Medicine

## 2020-03-24 DIAGNOSIS — R102 Pelvic and perineal pain: Secondary | ICD-10-CM

## 2020-04-04 ENCOUNTER — Ambulatory Visit
Admission: RE | Admit: 2020-04-04 | Discharge: 2020-04-04 | Disposition: A | Discharge status: Home / Self Care | Payer: Medicaid Other | Source: Ambulatory Visit | Attending: Family Medicine | Admitting: Family Medicine

## 2020-04-04 ENCOUNTER — Other Ambulatory Visit: Payer: Self-pay

## 2020-04-04 DIAGNOSIS — R102 Pelvic and perineal pain: Secondary | ICD-10-CM | POA: Diagnosis present

## 2020-07-12 ENCOUNTER — Telehealth (HOSPITAL_COMMUNITY): Payer: Self-pay | Admitting: Nurse Practitioner

## 2020-07-12 DIAGNOSIS — Z2883 Immunization not carried out due to unavailability of vaccine: Secondary | ICD-10-CM

## 2020-07-12 NOTE — Telephone Encounter (Signed)
Received call from patient on the MAB infusion hotline. She wishes to get the covid 19 vaccine. She believed there was a clinic at [facility] today and wanted to get a vaccine. She does not have transportation. I spoke to [employer] department who says this is a testing site. They are giving vaccines at the health department. I made patient appointment for vaccine. She has medicaid coverage and I called to assist her with transportation and left message. I provided her with my number to call back if problems with getting vaccinated. This would be her first vaccine.

## 2020-12-14 ENCOUNTER — Other Ambulatory Visit: Payer: Self-pay | Admitting: Family Medicine

## 2020-12-14 DIAGNOSIS — Z1231 Encounter for screening mammogram for malignant neoplasm of breast: Secondary | ICD-10-CM

## 2021-01-09 ENCOUNTER — Other Ambulatory Visit: Payer: Self-pay

## 2021-01-09 ENCOUNTER — Ambulatory Visit
Admission: RE | Admit: 2021-01-09 | Discharge: 2021-01-09 | Disposition: A | Discharge status: Home / Self Care | Payer: Medicaid Other | Source: Ambulatory Visit | Attending: Family Medicine | Admitting: Family Medicine

## 2021-01-09 DIAGNOSIS — Z1231 Encounter for screening mammogram for malignant neoplasm of breast: Secondary | ICD-10-CM | POA: Insufficient documentation

## 2021-08-02 IMAGING — MG MM DIGITAL SCREENING BILAT W/ TOMO AND CAD
8 series · 9 of 24 positions shown · non-contrast
Comparison: Previous exam(s).

CLINICAL DATA: Screening.

EXAM:
DIGITAL SCREENING BILATERAL MAMMOGRAM WITH TOMOSYNTHESIS AND CAD
TECHNIQUE: Bilateral screening digital craniocaudal and mediolateral oblique
mammograms were obtained. Bilateral screening digital breast
tomosynthesis was performed. The images were evaluated with
computer-aided detection.

[R CC synth-2D]
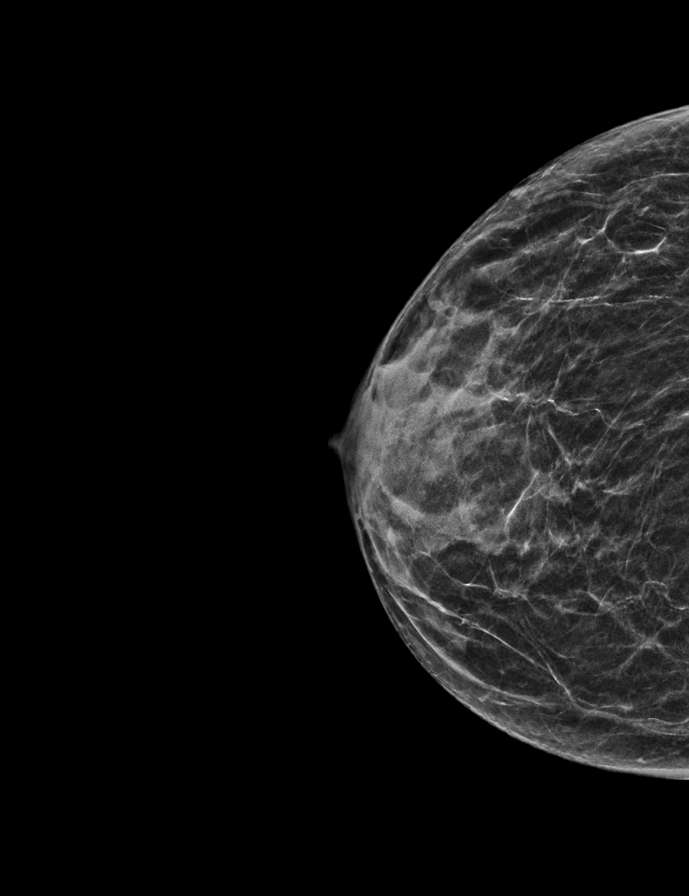

[R MLO synth-2D]
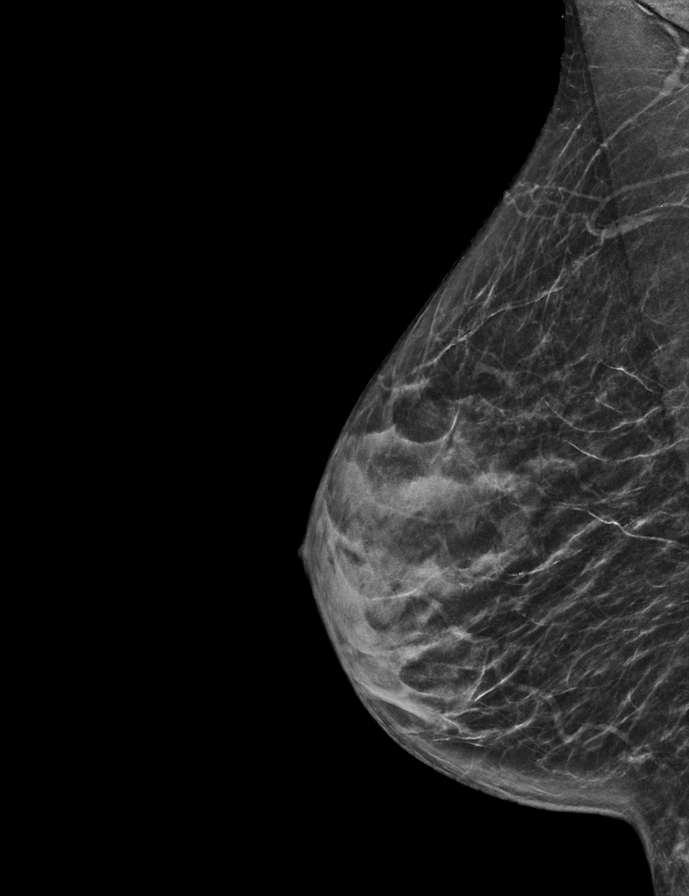

[L MLO synth-2D]
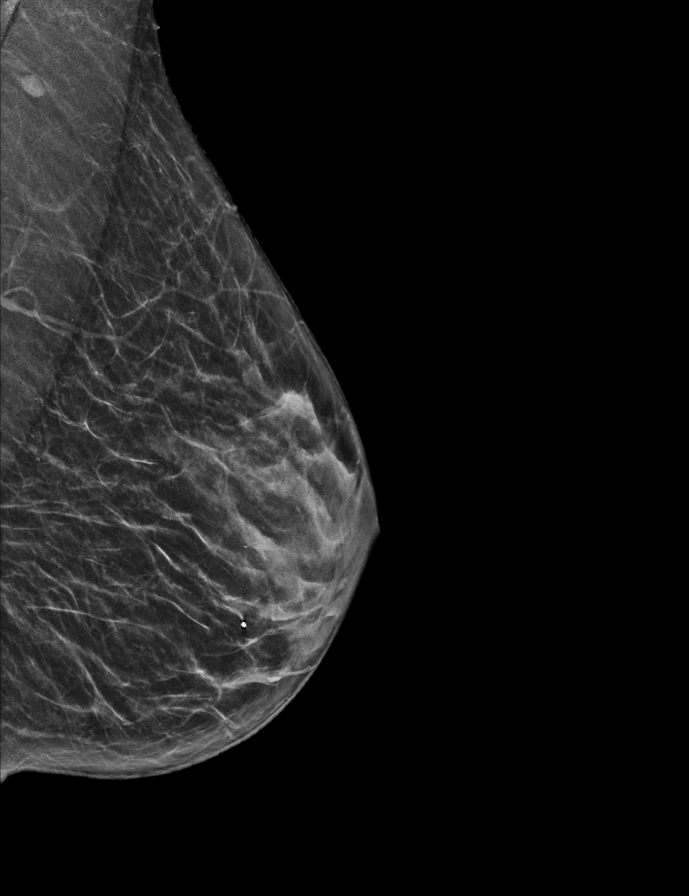

[L CC synth-2D]
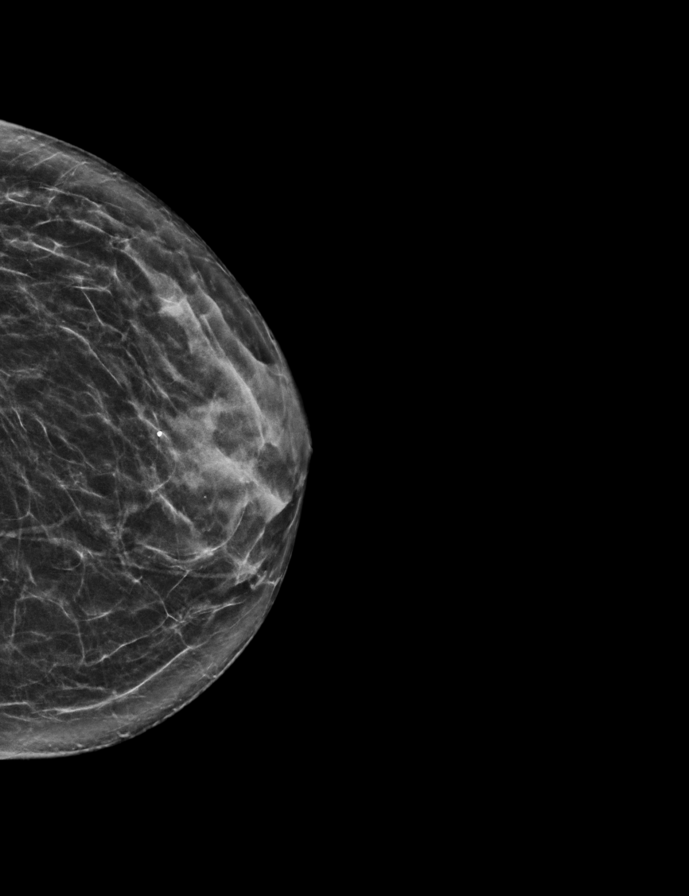

[L CC tomo · 2 of 54 frames shown]
[frame 18/54]
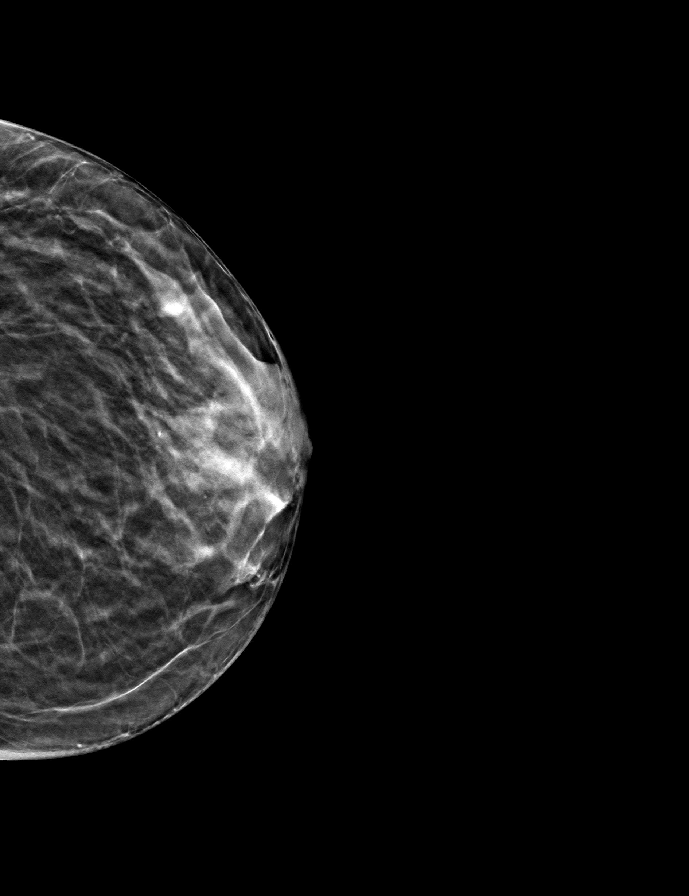
[frame 27/54]
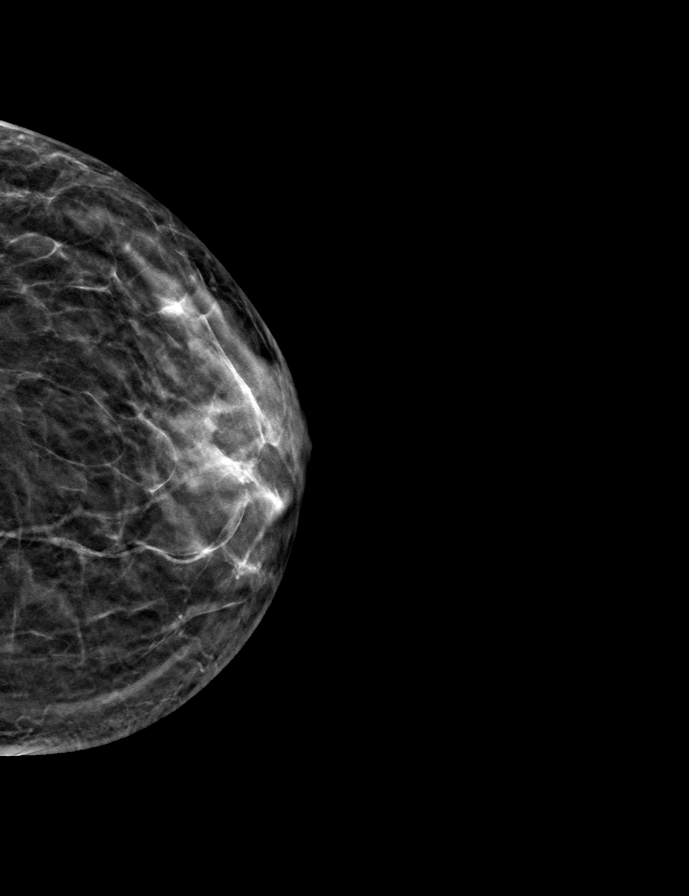

[R MLO tomo · tomo slice 25/50.0]
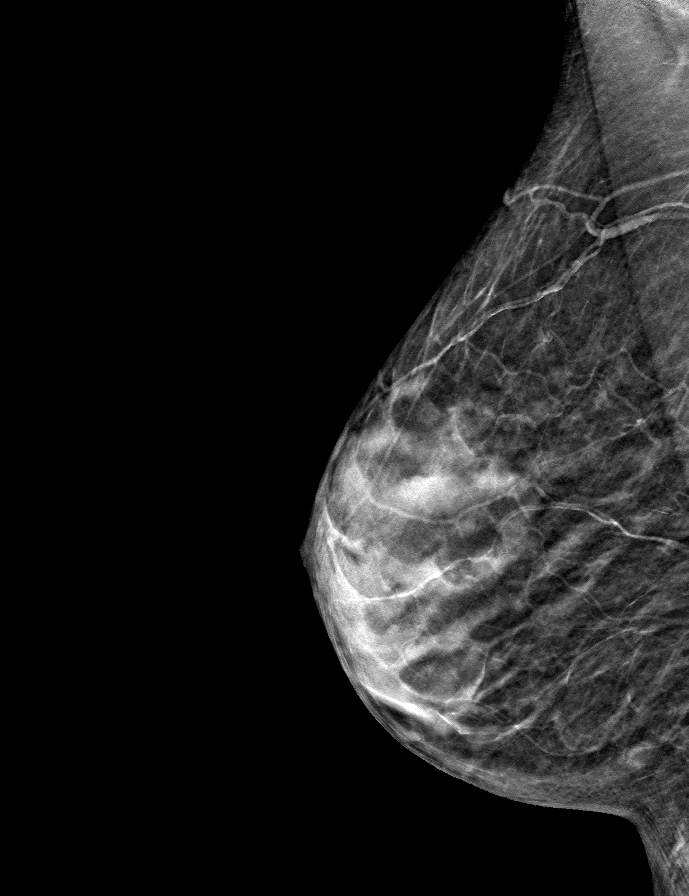

[R CC tomo · tomo slice 19/38.0]
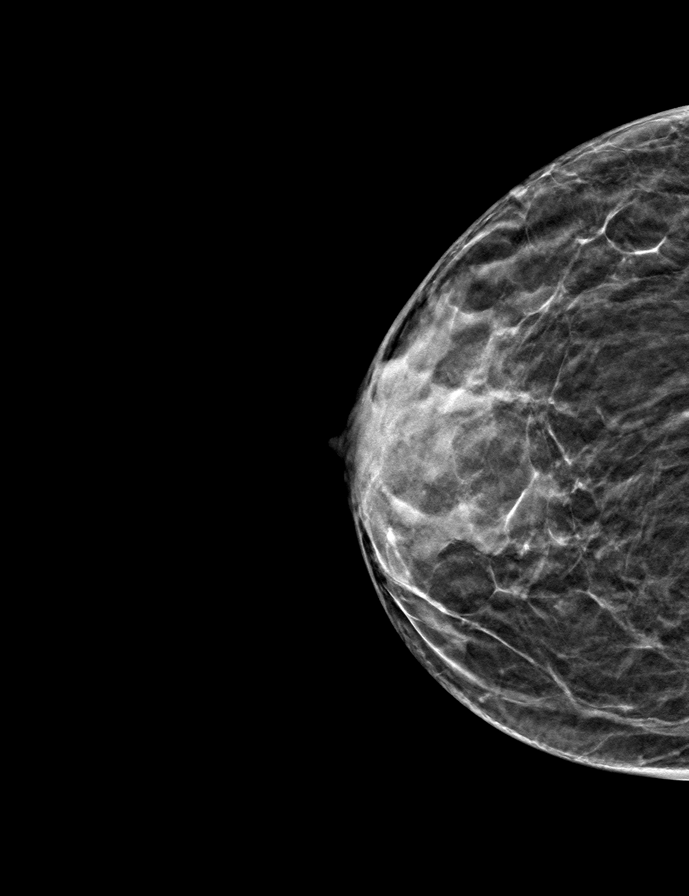

[L MLO tomo · tomo slice 27/52.0]
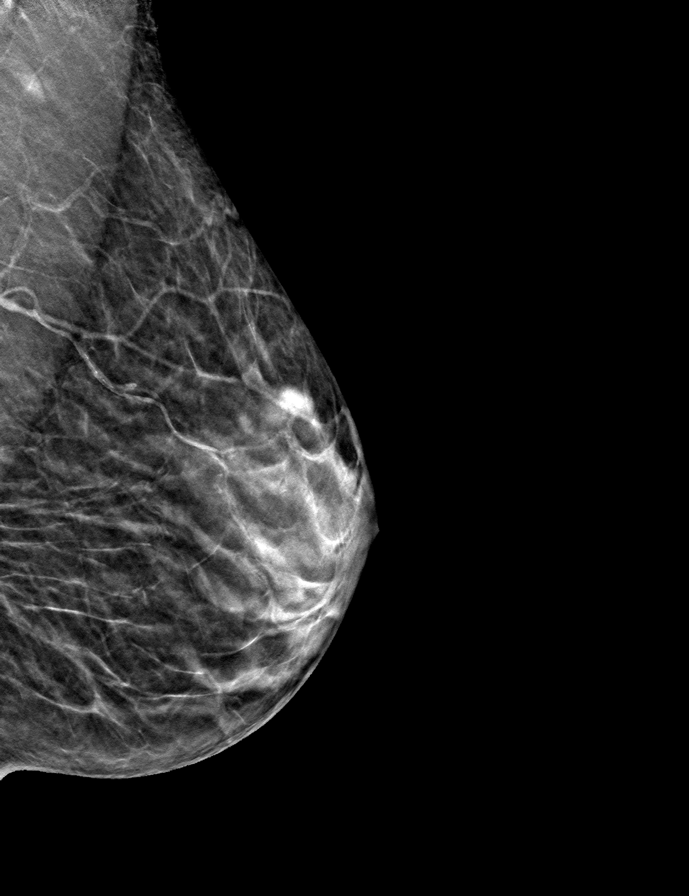

[9 of 24 positions shown; findings below may reference images not displayed]

ACR Breast Density Category c: The breast tissue is heterogeneously
dense, which may obscure small masses.
FINDINGS: There are no findings suspicious for malignancy. The images were
evaluated with computer-aided detection.
IMPRESSION: No mammographic evidence of malignancy. A result letter of this
screening mammogram will be mailed directly to the patient.

RECOMMENDATION:
Screening mammogram in one year. (Code:T4-5-GWO)

BI-RADS CATEGORY  1: Negative.

## 2023-01-15 ENCOUNTER — Other Ambulatory Visit: Payer: Self-pay | Admitting: Family Medicine

## 2023-01-15 DIAGNOSIS — R1900 Intra-abdominal and pelvic swelling, mass and lump, unspecified site: Secondary | ICD-10-CM

## 2023-01-15 DIAGNOSIS — Z1231 Encounter for screening mammogram for malignant neoplasm of breast: Secondary | ICD-10-CM

## 2023-01-15 DIAGNOSIS — E041 Nontoxic single thyroid nodule: Secondary | ICD-10-CM

## 2023-01-24 ENCOUNTER — Ambulatory Visit: Admission: RE | Admit: 2023-01-24 | Payer: Medicaid Other | Source: Ambulatory Visit

## 2023-02-01 ENCOUNTER — Encounter: Payer: Self-pay | Admitting: Internal Medicine

## 2023-02-01 ENCOUNTER — Other Ambulatory Visit
Admission: RE | Admit: 2023-02-01 | Discharge: 2023-02-01 | Disposition: A | Discharge status: Home / Self Care | Payer: Medicaid Other | Source: Ambulatory Visit | Attending: Internal Medicine | Admitting: Internal Medicine

## 2023-02-01 ENCOUNTER — Ambulatory Visit (INDEPENDENT_AMBULATORY_CARE_PROVIDER_SITE_OTHER): Payer: Medicaid Other | Admitting: Internal Medicine

## 2023-02-01 VITALS — BP 110/70 | HR 66 | Temp 97.6°F | Ht 63.0 in | Wt 101.6 lb

## 2023-02-01 DIAGNOSIS — J4489 Other specified chronic obstructive pulmonary disease: Secondary | ICD-10-CM

## 2023-02-01 DIAGNOSIS — F17210 Nicotine dependence, cigarettes, uncomplicated: Secondary | ICD-10-CM | POA: Diagnosis not present

## 2023-02-01 LAB — CBC WITH DIFFERENTIAL/PLATELET
Abs Immature Granulocytes: 0.03 10*3/uL (ref 0.00–0.07)
Basophils Absolute: 0.1 10*3/uL (ref 0.0–0.1)
Basophils Relative: 1 %
Eosinophils Absolute: 0.1 10*3/uL (ref 0.0–0.5)
Eosinophils Relative: 2 %
HCT: 42.8 % (ref 36.0–46.0)
Hemoglobin: 13.2 g/dL (ref 12.0–15.0)
Immature Granulocytes: 0 %
Lymphocytes Relative: 42 %
Lymphs Abs: 3.2 10*3/uL (ref 0.7–4.0)
MCH: 31.4 pg (ref 26.0–34.0)
MCHC: 30.8 g/dL (ref 30.0–36.0)
MCV: 101.7 fL — ABNORMAL HIGH (ref 80.0–100.0)
Monocytes Absolute: 0.4 10*3/uL (ref 0.1–1.0)
Monocytes Relative: 5 %
Neutro Abs: 3.8 10*3/uL (ref 1.7–7.7)
Neutrophils Relative %: 50 %
Platelets: 222 10*3/uL (ref 150–400)
RBC: 4.21 MIL/uL (ref 3.87–5.11)
RDW: 11.9 % (ref 11.5–15.5)
WBC: 7.6 10*3/uL (ref 4.0–10.5)
nRBC: 0 % (ref 0.0–0.2)

## 2023-02-01 MED ORDER — BREZTRI AEROSPHERE 160-9-4.8 MCG/ACT IN AERO: 2.0000 | INHALATION_SPRAY | Freq: Two times a day (BID) | RESPIRATORY_TRACT | 0 refills | Status: AC

## 2023-02-01 MED ORDER — BUDESONIDE-FORMOTEROL FUMARATE 80-4.5 MCG/ACT IN AERO: INHALATION_SPRAY | RESPIRATORY_TRACT | 12 refills | Status: AC

## 2023-02-01 NOTE — Patient Instructions (Signed)
Plan A = Automatic = Always=    Symbicort 80 (Breztri until use it up) Take 2 puffs first thing in am and then another 2 puffs about 12 hours later.    Work on inhaler technique:  relax and gently blow all the way out then take a nice smooth full deep breath back in, triggering the inhaler at same time you start breathing in.  Hold breath in for at least  5 seconds if you can. Blow out symbicort  thru nose. Rinse and gargle with water when done.  If mouth or throat bother you at all,  try brushing teeth/gums/tongue with arm and hammer toothpaste/ make a slurry and gargle and spit out.      Plan B = Backup (to supplement plan A, not to replace it) Only use your albuterol inhaler as a rescue medication to be used if you can't catch your breath by resting or doing a relaxed purse lip breathing pattern.  - The less you use it, the better it will work when you need it. - Ok to use the inhaler up to 2 puffs  every 4 hours if you must but call for appointment if use goes up over your usual need - Don't leave home without it !!  (think of it like the spare tire for your car)     The key is to stop smoking completely before smoking completely stops you!   Please schedule a follow up office visit in 6-8  weeks, call sooner if needed with all medications /inhalers/ solutions in hand so we can verify exactly what you are taking. This includes all medications from all doctors and over the counters

## 2023-02-01 NOTE — Progress Notes (Unsigned)
Angel Juarez, female    DOB: 05-31-1971   MRN: [id number]   Brief patient profile:  40  yobf from [place]   active smoker  referred to pulmonary clinic in [facility]  02/01/2023 by Hendricks Milo for sob           History of Present Illness  02/01/2023  Pulmonary/ 1st office eval/ Angel Juarez / [employer] no meds  am of ov  Chief Complaint  Patient presents with   Consult    Told she had asthma in the past. No SOB or cough. Occasional wheezing.   Dyspnea:  worse in cold weather or dust  Cough: dry harsh Sleep: bed is flat, no problems  SABA use: 3 x weekly   No obvious other day to day or daytime pattern/s in variability or assoc excess/ purulent sputum or mucus plugs or hemoptysis or cp or chest tightness, subjective wheeze or overt sinus or hb symptoms.   Sleeping  without nocturnal  or early am exacerbation  of respiratory  c/o's or need for noct saba.    No unusual exposure hx or h/o childhood pna/ asthma or knowledge of premature birth.  Current Allergies, Complete Past Medical History, Past Surgical History, Family History, and Social History were reviewed in [employer] record.  ROS  The following are not active complaints unless bolded Hoarseness, sore throat, dysphagia, dental problems, itching, sneezing,  nasal congestion or discharge of excess mucus or purulent secretions, ear ache,   fever, chills, sweats, unintended wt loss or wt gain, classically pleuritic or exertional cp,  orthopnea pnd or arm/hand swelling  or leg swelling, presyncope, palpitations, abdominal pain, anorexia, nausea, vomiting, diarrhea  or change in bowel habits or change in bladder habits, change in stools or change in urine, dysuria, hematuria,  rash, arthralgias, visual complaints, headache, numbness, weakness or ataxia or problems with walking or coordination,  change in mood or  memory.           Past Medical History:  Diagnosis Date   Asthma    COPD (chronic obstructive  pulmonary disease) ([facility])    Depression     Outpatient Medications Prior to Visit  Medication Sig Dispense Refill   albuterol (PROVENTIL HFA;VENTOLIN HFA) 108 (90 Base) MCG/ACT inhaler Inhale 2 puffs into the lungs every 6 (six) hours as needed for wheezing or shortness of breath.     D-5000 125 MCG (5000 UT) TABS Take 1 tablet by mouth once a week.     diclofenac Sodium (VOLTAREN) 1 % GEL Apply 2 g topically as needed.     fluticasone (FLONASE) 50 MCG/ACT nasal spray Place 1 spray into both nostrils daily.     Fluticasone-Salmeterol (ADVAIR) 250-50 MCG/DOSE AEPB Inhale 1 puff into the lungs 2 (two) times daily.     ibuprofen (ADVIL,MOTRIN) 800 MG tablet Take 1 tablet (800 mg total) by mouth every 8 (eight) hours as needed. 30 tablet 0   lurasidone (LATUDA) 40 MG TABS tablet Take 40 mg by mouth daily.     MIRALAX 17 GM/SCOOP powder 1 Scoopful By Mouth 3 Times Daily     SPIRIVA HANDIHALER 18 MCG inhalation capsule Place 1 capsule into inhaler and inhale daily.     diazepam (VALIUM) 5 MG tablet Take 1 tablet (5 mg total) by mouth every 8 (eight) hours as needed for muscle spasms. (Patient not taking: Reported on 02/01/2023) 20 tablet 0   naproxen (NAPROSYN) 500 MG tablet Take 1 tablet (500  mg total) by mouth 2 (two) times daily with a meal. (Patient not taking: Reported on 02/01/2023) 60 tablet 1   sertraline (ZOLOFT) 25 MG tablet Take 25 mg by mouth daily. (Patient not taking: Reported on 02/01/2023)     Tiotropium Bromide Monohydrate (SPIRIVA RESPIMAT) 1.25 MCG/ACT AERS Inhale into the lungs daily. One puff daily     No facility-administered medications prior to visit.     Objective:     BP 110/70 (BP Location: Left Arm, Cuff Size: Normal)   Pulse 66   Temp 97.6 F (36.4 C)   Ht 5\' 3"  (1.6 m)   Wt 101 lb 9.6 oz (46.1 kg)   SpO2 99%   BMI 18.00 kg/m   SpO2: 99 %  Amb bf nad quite easily confused with details of care  HEENT : Oropharynx  clear/ very poor dentition   Nasal  turbinates nl    NECK :  without  apparent JVD/ palpable Nodes/TM    LUNGS: no acc muscle use,  Mild barrel  contour chest wall with bilateral  min insp /exp rhonchi and  without cough on insp or exp maneuvers  and mild  Hyperresonant  to  percussion bilaterally     CV:  RRR  no s3 or murmur or increase in P2, and no edema   ABD:  soft and nontender with pos end  insp Hoover's  in the supine position.  No bruits or organomegaly appreciated   MS:  Nl gait/ ext warm without deformities Or obvious joint restrictions  calf tenderness, cyanosis or clubbing     SKIN: warm and dry without lesions    NEURO:  alert, approp, nl sensorium with  no motor or cerebellar deficits apparent.          Assessment   No problem-specific Assessment & Plan notes found for this encounter.     Christinia Gully, MD 02/01/2023

## 2023-02-02 ENCOUNTER — Encounter: Payer: Self-pay | Admitting: Internal Medicine

## 2023-02-02 DIAGNOSIS — F17210 Nicotine dependence, cigarettes, uncomplicated: Secondary | ICD-10-CM | POA: Insufficient documentation

## 2023-02-02 NOTE — Assessment & Plan Note (Signed)
4-5 min discussion re active cigarette smoking in addition to office E&M  Ask about tobacco use:   ongoing Advise quitting   I took an extended  opportunity with this patient to outline the consequences of continued cigarette use  in airway disorders based on all the data we have from the multiple national lung health studies (perfomed over decades at millions of dollars in cost)  indicating that smoking cessation, not choice of inhalers or physicians, is the most important aspect of her care.   Assess willingness:  Not committed at this point Assist in quit attempt:  Per PCP when ready Arrange follow up:   Follow up per Primary Care planned  For smoking cessation classes call [phone]      

## 2023-02-02 NOTE — Assessment & Plan Note (Addendum)
Onset ? In her early 2s  - Allergy screen 02/01/2023 >  Eos 0. /  IgE   - 02/01/2023  After extensive coaching inhaler device,  effectiveness = 75% (short Ti) > use Breztri as sample then start symbicort 80 2bid as cough is the main symptom  DDX of  difficult airways management almost all start with A and  include Adherence, Ace Inhibitors, Acid Reflux, Active Sinus Disease, Alpha 1 Antitripsin deficiency, Anxiety masquerading as Airways dz,  ABPA,  Allergy(esp in young), Aspiration (esp in elderly), Adverse effects of meds,  Active smoking or vaping, A bunch of PE's (a small clot burden can't cause this syndrome unless there is already severe underlying pulm or vascular dz with poor reserve) plus two Bs  = Bronchiectasis and Beta blocker use..and one C= CHF  Adherence is always the initial "prime suspect" and is a multilayered concern that requires a "trust but verify" approach in every patient - starting with knowing how to use medications, especially inhalers, correctly, keeping up with refills and understanding the fundamental difference between maintenance and prns vs those medications only taken for a very short course and then stopped and not refilled.  - see hfa teaching  - return with all meds in hand using a trust but verify approach to confirm accurate Medication  Reconciliation The principal here is that until we are certain that the  patients are doing what we've asked, it makes no sense to ask them to do more.   Active smoking greatest concern (see separate a/p)   Adverse effects of dpi causing daytime cough > try off spiriva and advair and replace with low dose symbicort hfa, the least likely to cause cough (other than neb)   ? Allergy >  Eos only 0.1 / IgE pending, use low dose ICS for now as cough is the main concern  ? Anxiety > usually at the bottom of this list of usual suspects but should be much higher on this pt's based on H and P and note already on psychotropics and may  interfere with adherence and also interpretation of response or lack thereof to symptom management which can be quite subjective > defer to pcp          Each maintenance medication was reviewed in detail including emphasizing most importantly the difference between maintenance and prns and under what circumstances the prns are to be triggered using an action plan format where appropriate.  Total time for H and P, chart review, counseling, reviewing hfa/dpi device(s) and generating customized AVS unique to this office visit / same day charting = 45 min

## 2023-02-07 LAB — IGE: IgE (Immunoglobulin E), Serum: 77 IU/mL (ref 6–495)

## 2023-03-19 ENCOUNTER — Telehealth: Payer: Self-pay | Admitting: Internal Medicine

## 2023-03-19 NOTE — Telephone Encounter (Signed)
Pls advise what medicine the patient is currently taken

## 2023-03-21 ENCOUNTER — Ambulatory Visit
Admission: RE | Admit: 2023-03-21 | Discharge: 2023-03-21 | Disposition: A | Discharge status: Home / Self Care | Payer: Medicaid Other | Source: Ambulatory Visit | Attending: Family Medicine | Admitting: Family Medicine

## 2023-03-21 DIAGNOSIS — Z1231 Encounter for screening mammogram for malignant neoplasm of breast: Secondary | ICD-10-CM | POA: Diagnosis present

## 2023-03-22 NOTE — Telephone Encounter (Signed)
Called patient to get authorization to give medication list to [facility].  Per patient, she does not know who this is.  Informed patient we will NOT give her medical information out.   Called [facility] at number listed in telephone contacts section.  Spoke with Lubertha Sayres. She said this was in regard to [employer].  Clydie Braun was unable to give me the patients name, just that she had called our office about a patient.  DID NOT GIVE THE PATIENTS NAME TO HER AND INFORMED HER THE PATIENT DID NOT AUTHORIZE ANY ACCESS TO THE MEDICAL RECORDS.  Clydie Braun said "thank you" and ended call.  Nothing further needed.  Will close encounter.

## 2023-03-26 ENCOUNTER — Ambulatory Visit: Payer: Medicaid Other

## 2023-03-26 ENCOUNTER — Ambulatory Visit: Payer: Medicaid Other | Attending: Family Medicine

## 2023-03-29 ENCOUNTER — Ambulatory Visit: Payer: Medicaid Other | Admitting: Internal Medicine

## 2023-03-29 NOTE — Progress Notes (Deleted)
Angel Juarez, female    DOB: 1971-01-25   MRN: [id number]   Brief patient profile:  58  yobf from [place]   active smoker  referred to pulmonary clinic in [facility]  02/01/2023 by Lorenda Cahill for sob           History of Present Illness  02/01/2023  Pulmonary/ 1st office eval/ Joscelyne Renville / [employer] did not take or bring meds  am of ov and confused with names  Chief Complaint  Patient presents with   Consult    Told she had asthma in the past. No SOB or cough. Occasional wheezing.   Dyspnea:  worse in cold weather or dust exp Cough: dry harsh day > noct  better with tyl # 3  Sleep: bed is flat, no problems  SABA use: 3 x weekly  Rec Plan A = Automatic = Always=    Symbicort 80 (Breztri until use it up) Take 2 puffs first thing in am and then another 2 puffs about 12 hours later.  Work on inhaler technique:   Plan B = Backup (to supplement plan A, not to replace it) Only use your albuterol inhaler as a rescue medication   The key is to stop smoking completely before smoking completely stops you!  Please schedule a follow up office visit in 6-8  weeks, call sooner if needed with all medications /inhalers/ solutions in hand    Allergy screen 02/01/2023 >  Eos 0.1 /  IgE  77  03/29/2023  f/u ov/Merleen Picazo/ [facility] re: ***   maint on *** did *** bring meds No chief complaint on file.   Dyspnea:  *** Cough: *** Sleeping: *** SABA use: *** 02: *** Lung cancer sreening  ***   No obvious day to day or daytime variability or assoc excess/ purulent sputum or mucus plugs or hemoptysis or cp or chest tightness, subjective wheeze or overt sinus or hb symptoms.   *** without nocturnal  or early am exacerbation  of respiratory  c/o's or need for noct saba. Also denies any obvious fluctuation of symptoms with weather or environmental changes or other aggravating or alleviating factors except as outlined above   No unusual exposure hx or h/o childhood pna/ asthma or knowledge of  premature birth.  Current Allergies, Complete Past Medical History, Past Surgical History, Family History, and Social History were reviewed in [employer] record.  ROS  The following are not active complaints unless bolded Hoarseness, sore throat, dysphagia, dental problems, itching, sneezing,  nasal congestion or discharge of excess mucus or purulent secretions, ear ache,   fever, chills, sweats, unintended wt loss or wt gain, classically pleuritic or exertional cp,  orthopnea pnd or arm/hand swelling  or leg swelling, presyncope, palpitations, abdominal pain, anorexia, nausea, vomiting, diarrhea  or change in bowel habits or change in bladder habits, change in stools or change in urine, dysuria, hematuria,  rash, arthralgias, visual complaints, headache, numbness, weakness or ataxia or problems with walking or coordination,  change in mood or  memory.        No outpatient medications have been marked as taking for the 03/29/23 encounter (Appointment) with Nyoka Cowden, MD.                Past Medical History:  Diagnosis Date   Asthma    COPD (chronic obstructive pulmonary disease) ([facility])    Depression        Objective:  Wt Readings from Last 3 Encounters:  02/01/23 101 lb 9.6 oz (46.1 kg)  01/13/18 110 lb (49.9 kg)  01/10/18 110 lb (49.9 kg)      Vital signs reviewed  03/29/2023  - Note at rest 02 sats  ***% on ***   General appearance:    ***      very poor dentition ***   Mild barr ***   Rec cxr 02/01/2023 > did not go   Cxr 01/10/18 c/w copd   Labs ordered 02/01/2023  :  allergy screen   Eos 0.1/ IgE pending      Assessment

## 2023-04-05 ENCOUNTER — Ambulatory Visit
Admission: RE | Admit: 2023-04-05 | Discharge: 2023-04-05 | Disposition: A | Discharge status: Home / Self Care | Payer: Medicaid Other | Source: Ambulatory Visit | Attending: Family Medicine | Admitting: Family Medicine

## 2023-04-05 DIAGNOSIS — R1900 Intra-abdominal and pelvic swelling, mass and lump, unspecified site: Secondary | ICD-10-CM | POA: Diagnosis not present

## 2023-04-05 DIAGNOSIS — E041 Nontoxic single thyroid nodule: Secondary | ICD-10-CM | POA: Diagnosis present

## 2023-05-20 ENCOUNTER — Other Ambulatory Visit: Payer: Self-pay | Admitting: Family Medicine

## 2023-05-20 DIAGNOSIS — R590 Localized enlarged lymph nodes: Secondary | ICD-10-CM

## 2023-05-30 ENCOUNTER — Ambulatory Visit
Admission: RE | Admit: 2023-05-30 | Discharge: 2023-05-30 | Disposition: A | Discharge status: Home / Self Care | Payer: Medicaid Other | Source: Ambulatory Visit | Attending: Family Medicine | Admitting: Family Medicine

## 2023-05-30 DIAGNOSIS — R590 Localized enlarged lymph nodes: Secondary | ICD-10-CM | POA: Diagnosis present

## 2023-05-30 MED ORDER — IOHEXOL 300 MG/ML  SOLN
75.0000 mL | Freq: Once | INTRAMUSCULAR | Status: AC | PRN
  Administered 2023-05-30: 75 mL via INTRAVENOUS

## 2023-05-30 MED ORDER — SODIUM CHLORIDE 0.9 % IV BOLUS
50.0000 mL | Freq: Once | INTRAVENOUS | Status: AC
  Administered 2023-05-30: 50 mL via INTRAVENOUS

## 2023-07-22 ENCOUNTER — Telehealth: Payer: Self-pay | Admitting: Physician Assistant

## 2023-07-22 NOTE — Telephone Encounter (Signed)
Patient called in to check on her appointment. She stated that someone called her trying to schedule an appointment. I inform her that was a error call because her appointment is still the same.

## 2023-08-14 ENCOUNTER — Telehealth: Payer: Self-pay | Admitting: Physician Assistant

## 2023-08-14 NOTE — Telephone Encounter (Signed)
Clydie Braun called from the PCP office to confirm and get the address with the time for the appointment.

## 2023-08-19 NOTE — Progress Notes (Unsigned)
Celso Amy, PA-C [address]  [address]  [place], [place] [address]  Main: [phone]  Fax: [phone]   Gastroenterology Consultation  Referring Provider:     Lorn Junes, FNP Primary Care Physician:  Lorn Junes, FNP Primary Gastroenterologist:  Celso Amy, PA-C / Dr. Allegra Lai Reason for Consultation:     Underweight        HPI:   Angel Juarez is a 52 y.o. y/o female referred for consultation & management  by Lorn Junes, FNP.  Patient states she is having unintentional weight loss and increasing constipation for 1 year.  She has had constipation for 2 or 3 years which is worsening.  She reports hard stools and straining with small bowel movements.  Constipation improves if she eats more fruits and vegetables.  Was recently started on MiraLAX which helps.  She eats 2 meals a day.  Reports intermittent episodes of lower abdominal cramping which comes and goes.  No current abdominal pain now.  She denies rectal bleeding.  Has occasional episodic nausea and vomiting which are random.  She denies heartburn or dysphagia.  Has poor dentition.  In 2018 weight was 123 pounds.  11/2017 weight 114 pounds.  01/2018 weight 110 pounds.  01/2023 weight 101.  Patient reports having a colonoscopy over 20 years ago in her 30s which was normal.  No other GI evaluation since then.  No previous EGD.  Lab 06/2023: Normal CBC, CMP, TSH, B12, and A1c.  Hemoglobin 13.5.  Pelvic transvaginal ultrasound 03/2023: Hysterectomy, normal ovaries, nothing acute.  No abdominal or pelvic CT.  Has history of COPD.  Continues to smoke.  Past Medical History:  Diagnosis Date   Asthma    COPD (chronic obstructive pulmonary disease) ([facility])    Depression     Past Surgical History:  Procedure Laterality Date   ABDOMINAL HYSTERECTOMY      Prior to Admission medications   Medication Sig Start Date End Date Taking? Authorizing Provider  albuterol (PROVENTIL HFA;VENTOLIN HFA) 108 (90 Base)  MCG/ACT inhaler Inhale 2 puffs into the lungs every 6 (six) hours as needed for wheezing or shortness of breath.    [provider]  Budeson-Glycopyrrol-Formoterol (BREZTRI AEROSPHERE) 160-9-4.8 MCG/ACT AERO Inhale 2 puffs into the lungs in the morning and at bedtime. 02/01/23   Nyoka Cowden, MD  budesonide-formoterol (SYMBICORT) 80-4.5 MCG/ACT inhaler Take 2 puffs first thing in am and then another 2 puffs about 12 hours later. 02/01/23   Nyoka Cowden, MD  D-5000 125 MCG (5000 UT) TABS Take 1 tablet by mouth once a week. 10/18/22   [provider]  diclofenac Sodium (VOLTAREN) 1 % GEL Apply 2 g topically as needed. 10/18/22   [provider]  fluticasone (FLONASE) 50 MCG/ACT nasal spray Place 1 spray into both nostrils daily. 10/18/22   [provider]  ibuprofen (ADVIL,MOTRIN) 800 MG tablet Take 1 tablet (800 mg total) by mouth every 8 (eight) hours as needed. 01/10/18   Emily Filbert, MD  lurasidone (LATUDA) 40 MG TABS tablet Take 40 mg by mouth daily. 01/31/23   [provider]  MIRALAX 17 GM/SCOOP powder 1 Scoopful By Mouth 3 Times Daily 10/18/22   [provider]    Family History  Problem Relation Age of Onset   Breast cancer Sister      Social History   Tobacco Use   Smoking status: Every Day    Current packs/day: 2.50    Average  packs/day: 2.5 packs/day for 35.0 years (87.5 ttl pk-yrs)    Types: Cigarettes   Smokeless tobacco: Never   Tobacco comments:    8 cigarettes a day. khj  Vaping Use   Vaping status: Former  Substance Use Topics   Alcohol use: Yes    Comment: weekly   Drug use: No    Allergies as of 08/20/2023 - Review Complete 08/20/2023  Allergen Reaction Noted   Shellfish allergy Anaphylaxis 03/14/2016   Amoxicillin Hives 03/14/2016    Review of Systems:    All systems reviewed and negative except where noted in HPI.   Physical Exam:  BP (!) 89/62   Pulse 88   Temp 97.9 F (36.6 C) (Oral)   Ht  5\' 4"  (1.626 m)   Wt 96 lb 9.6 oz (43.8 kg)   BMI 16.58 kg/m  No LMP recorded. Patient has had a hysterectomy.  General:   Alert,  Well-developed, very thin female, pleasant and cooperative in NAD Mouth: Poor dention. Lungs:  Respirations even and unlabored.  Mild expiratory wheezes.   No Rales, crackles, or rhonchi. No acute distress. Heart:  Regular rate and rhythm; no murmurs, clicks, rubs, or gallops. Abdomen:  Normal bowel sounds.  No bruits.  Soft, and very thin without masses, hepatosplenomegaly or hernias noted.  Mild epigastric tenderness.  Rest of abdomen is not tender no guarding or rebound tenderness.    Neurologic:  Alert and oriented x3;  grossly normal neurologically. Psych:  Alert and cooperative. Normal mood and affect.  Imaging Studies: No results found.  Assessment and Plan:   Angel Juarez is a 52 y.o. y/o female has been referred for underweight and unintentional weight loss.  Recent labs CBC, CMP, TSH normal.  Pelvic transvaginal ultrasound unrevealing.  She has not had any abdominal imaging.  No previous colonoscopy.  1.  Unintentional weight loss / underweight  I am ordering CT abdomen pelvis with contrast.  She has been scheduled for chest x-ray by her PCP given her history of smoking.  2.  Epigastric pain  Scheduling EGD I discussed risks of EGD with patient to include risk of bleeding, perforation, and risk of sedation.  Patient expressed understanding and agrees to proceed with EGD.  Follow-up  3.  Intermittent episodes of nausea and vomiting  Rx Zofran 4 mg every 6 hours as needed.  4.  Constipation, worsening for 1 year  Continue MiraLAX.  Encouraged to take MiraLAX 17 g every day.  Recommend high-fiber diet with fruits and vegetables.  Recommend 64 ounces of fluid daily.  5.  Colon cancer screening  Scheduling Colonoscopy I discussed risks of colonoscopy with patient to include risk of bleeding, colon perforation, and risk of sedation.  Patient  expressed understanding and agrees to proceed with colonoscopy.   2-day prep: Liquids x 2 days: Clenpiq x 2 days (samples given)  Follow up 4 weeks after EGD and colonoscopy with TG  Celso Amy, PA-C

## 2023-08-20 ENCOUNTER — Other Ambulatory Visit: Payer: Self-pay

## 2023-08-20 ENCOUNTER — Encounter: Payer: Self-pay | Admitting: Physician Assistant

## 2023-08-20 ENCOUNTER — Ambulatory Visit (INDEPENDENT_AMBULATORY_CARE_PROVIDER_SITE_OTHER): Payer: Medicaid Other | Admitting: Physician Assistant

## 2023-08-20 VITALS — BP 89/62 | HR 88 | Temp 97.9°F | Ht 64.0 in | Wt 96.6 lb

## 2023-08-20 DIAGNOSIS — Z1211 Encounter for screening for malignant neoplasm of colon: Secondary | ICD-10-CM

## 2023-08-20 DIAGNOSIS — R634 Abnormal weight loss: Secondary | ICD-10-CM

## 2023-08-20 DIAGNOSIS — K5900 Constipation, unspecified: Secondary | ICD-10-CM | POA: Diagnosis not present

## 2023-08-20 DIAGNOSIS — R1084 Generalized abdominal pain: Secondary | ICD-10-CM | POA: Diagnosis not present

## 2023-08-20 DIAGNOSIS — R112 Nausea with vomiting, unspecified: Secondary | ICD-10-CM | POA: Diagnosis not present

## 2023-08-20 MED ORDER — ONDANSETRON HCL 4 MG PO TABS
4.0000 mg | ORAL_TABLET | Freq: Three times a day (TID) | ORAL | 2 refills | Status: AC | PRN
Start: 2023-08-20 — End: 2023-11-18

## 2023-08-20 NOTE — Patient Instructions (Signed)
Please arrive to your CT Scan at 1 PM to the [facility].

## 2023-08-23 ENCOUNTER — Ambulatory Visit: Admission: RE | Admit: 2023-08-23 | Payer: Medicaid Other | Source: Ambulatory Visit

## 2023-08-30 ENCOUNTER — Ambulatory Visit
Admission: RE | Admit: 2023-08-30 | Discharge: 2023-08-30 | Disposition: A | Discharge status: Home / Self Care | Payer: Medicaid Other | Source: Ambulatory Visit | Attending: Physician Assistant | Admitting: Physician Assistant

## 2023-08-30 DIAGNOSIS — R112 Nausea with vomiting, unspecified: Secondary | ICD-10-CM | POA: Diagnosis present

## 2023-08-30 DIAGNOSIS — R634 Abnormal weight loss: Secondary | ICD-10-CM | POA: Insufficient documentation

## 2023-08-30 DIAGNOSIS — R1084 Generalized abdominal pain: Secondary | ICD-10-CM | POA: Insufficient documentation

## 2023-08-30 MED ORDER — BARIUM SULFATE 2 % PO SUSP
450.0000 mL | ORAL | Status: AC
  Administered 2023-08-30 (×2): 450 mL via ORAL

## 2023-08-30 MED ORDER — IOHEXOL 300 MG/ML  SOLN
80.0000 mL | Freq: Once | INTRAMUSCULAR | Status: AC | PRN
  Administered 2023-08-30: 80 mL via INTRAVENOUS

## 2023-09-17 ENCOUNTER — Telehealth: Payer: Self-pay

## 2023-09-17 NOTE — Telephone Encounter (Signed)
Patient notified.  Call and notify patient abdominal pelvic CT with contrast is normal.  No acute abnormality to explain weight loss.  No evidence of cancer or masses.  This is great news!  Continue with current plan for EGD and colonoscopy as scheduled.  Celso Amy, PA-C

## 2023-09-17 NOTE — Progress Notes (Signed)
Call and notify patient abdominal pelvic CT with contrast is normal.  No acute abnormality to explain weight loss.  No evidence of cancer or masses.  This is great news!  Continue with current plan for EGD and colonoscopy as scheduled. Celso Amy, PA-C

## 2023-09-19 ENCOUNTER — Encounter: Payer: Self-pay | Admitting: Gastroenterology

## 2023-09-25 ENCOUNTER — Encounter: Payer: Self-pay | Admitting: Gastroenterology

## 2023-09-26 ENCOUNTER — Ambulatory Visit: Payer: Medicaid Other | Admitting: Certified Registered Nurse Anesthetist

## 2023-09-26 ENCOUNTER — Encounter: Payer: Self-pay | Admitting: Gastroenterology

## 2023-09-26 ENCOUNTER — Encounter
Admission: RE | Disposition: A | Discharge status: Home / Self Care | Payer: Self-pay | Source: Home / Self Care | Attending: Gastroenterology

## 2023-09-26 ENCOUNTER — Ambulatory Visit
Admission: RE | Admit: 2023-09-26 | Discharge: 2023-09-26 | Disposition: A | Discharge status: Home / Self Care | Payer: Medicaid Other | Attending: Gastroenterology | Admitting: Gastroenterology

## 2023-09-26 DIAGNOSIS — Z1211 Encounter for screening for malignant neoplasm of colon: Secondary | ICD-10-CM

## 2023-09-26 DIAGNOSIS — Z681 Body mass index (BMI) 19 or less, adult: Secondary | ICD-10-CM | POA: Insufficient documentation

## 2023-09-26 DIAGNOSIS — K3189 Other diseases of stomach and duodenum: Secondary | ICD-10-CM | POA: Insufficient documentation

## 2023-09-26 DIAGNOSIS — R112 Nausea with vomiting, unspecified: Secondary | ICD-10-CM

## 2023-09-26 DIAGNOSIS — F17210 Nicotine dependence, cigarettes, uncomplicated: Secondary | ICD-10-CM | POA: Insufficient documentation

## 2023-09-26 DIAGNOSIS — R634 Abnormal weight loss: Secondary | ICD-10-CM

## 2023-09-26 DIAGNOSIS — K2971 Gastritis, unspecified, with bleeding: Secondary | ICD-10-CM

## 2023-09-26 DIAGNOSIS — J449 Chronic obstructive pulmonary disease, unspecified: Secondary | ICD-10-CM | POA: Insufficient documentation

## 2023-09-26 DIAGNOSIS — K269 Duodenal ulcer, unspecified as acute or chronic, without hemorrhage or perforation: Secondary | ICD-10-CM

## 2023-09-26 DIAGNOSIS — R1084 Generalized abdominal pain: Secondary | ICD-10-CM

## 2023-09-26 DIAGNOSIS — K259 Gastric ulcer, unspecified as acute or chronic, without hemorrhage or perforation: Secondary | ICD-10-CM

## 2023-09-26 DIAGNOSIS — Z7951 Long term (current) use of inhaled steroids: Secondary | ICD-10-CM | POA: Diagnosis not present

## 2023-09-26 HISTORY — PX: COLONOSCOPY WITH PROPOFOL: SHX5780

## 2023-09-26 HISTORY — PX: ESOPHAGOGASTRODUODENOSCOPY (EGD) WITH PROPOFOL: SHX5813

## 2023-09-26 HISTORY — DX: Cardiac murmur, unspecified: R01.1

## 2023-09-26 HISTORY — PX: BIOPSY: SHX5522

## 2023-09-26 SURGERY — ESOPHAGOGASTRODUODENOSCOPY (EGD) WITH PROPOFOL
Anesthesia: General

## 2023-09-26 MED ORDER — OMEPRAZOLE 40 MG PO CPDR: 40.0000 mg | DELAYED_RELEASE_CAPSULE | Freq: Two times a day (BID) | ORAL | 2 refills | Status: AC

## 2023-09-26 MED ORDER — PROPOFOL 500 MG/50ML IV EMUL
INTRAVENOUS | Status: DC | PRN
  Administered 2023-09-26: 150 ug/kg/min via INTRAVENOUS
  Administered 2023-09-26: 80 mg via INTRAVENOUS

## 2023-09-26 MED ORDER — SODIUM CHLORIDE 0.9 % IV SOLN: INTRAVENOUS | Status: DC

## 2023-09-26 MED ORDER — LIDOCAINE HCL (CARDIAC) PF 100 MG/5ML IV SOSY
PREFILLED_SYRINGE | INTRAVENOUS | Status: DC | PRN
  Administered 2023-09-26: 50 mg via INTRAVENOUS

## 2023-09-26 MED ORDER — GLYCOPYRROLATE 0.2 MG/ML IJ SOLN
INTRAMUSCULAR | Status: DC | PRN
  Administered 2023-09-26: .2 mg via INTRAVENOUS

## 2023-09-26 NOTE — Anesthesia Preprocedure Evaluation (Signed)
Anesthesia Evaluation  Patient identified by MRN, date of birth, ID band Patient awake    Reviewed: Allergy & Precautions, NPO status , Patient's Chart, lab work & pertinent test results  Airway Mallampati: II  TM Distance: >3 FB Neck ROM: full    Dental  (+) Edentulous Upper, Missing   Pulmonary neg pulmonary ROS, COPD,  COPD inhaler, Current Smoker   Pulmonary exam normal  + decreased breath sounds      Cardiovascular Exercise Tolerance: Good negative cardio ROS Normal cardiovascular exam Rhythm:Regular Rate:Normal     Neuro/Psych    Depression    negative neurological ROS  negative psych ROS   GI/Hepatic negative GI ROS, Neg liver ROS,,,  Endo/Other  negative endocrine ROS    Renal/GU negative Renal ROS  negative genitourinary   Musculoskeletal negative musculoskeletal ROS (+)    Abdominal Normal abdominal exam  (+)   Peds negative pediatric ROS (+)  Hematology negative hematology ROS (+)   Anesthesia Other Findings Past Medical History: No date: Asthma No date: COPD (chronic obstructive pulmonary disease) ([facility]) No date: Depression No date: Heart murmur  Past Surgical History: No date: ABDOMINAL HYSTERECTOMY  BMI    Body Mass Index: 17.64 kg/m      Reproductive/Obstetrics negative OB ROS                             Anesthesia Physical Anesthesia Plan  ASA: 3  Anesthesia Plan: General   Post-op Pain Management:    Induction: Intravenous  PONV Risk Score and Plan: Propofol infusion and TIVA  Airway Management Planned: Natural Airway and Nasal Cannula  Additional Equipment:   Intra-op Plan:   Post-operative Plan:   Informed Consent: I have reviewed the patients History and Physical, chart, labs and discussed the procedure including the risks, benefits and alternatives for the proposed anesthesia with the patient or authorized representative who has indicated  his/her understanding and acceptance.     Dental Advisory Given  Plan Discussed with: CRNA and Surgeon  Anesthesia Plan Comments:        Anesthesia Quick Evaluation

## 2023-09-26 NOTE — Transfer of Care (Signed)
Immediate Anesthesia Transfer of Care Note  Patient: Angel Juarez  Procedure(s) Performed: ESOPHAGOGASTRODUODENOSCOPY (EGD) WITH PROPOFOL COLONOSCOPY WITH PROPOFOL BIOPSY  Patient Location: Endoscopy Unit  Anesthesia Type:General  Level of Consciousness: awake and drowsy  Airway & Oxygen Therapy: Patient Spontanous Breathing  Post-op Assessment: Report given to RN and Post -op Vital signs reviewed and stable  Post vital signs: Reviewed and stable  Last Vitals:  Vitals Value Taken Time  BP 99/76 09/26/23 1129  Temp    Pulse 84 09/26/23 1129  Resp 16 09/26/23 1129  SpO2 100 % 09/26/23 1129    Last Pain:  Vitals:   09/26/23 1129  TempSrc:   PainSc: Asleep         Complications: No notable events documented.

## 2023-09-26 NOTE — H&P (Signed)
Angel Repress, MD [address]  [address]  [place], [place] [address]  Main: [phone]  Fax: [phone] Pager: [phone]  Primary Care Physician:  Lorn Junes, FNP (Inactive) Primary Gastroenterologist:  Dr. Arlyss Juarez  Pre-Procedure History & Physical: HPI:  Angel Juarez is a 52 y.o. female is here for an endoscopy and colonoscopy.   Past Medical History:  Diagnosis Date   Asthma    COPD (chronic obstructive pulmonary disease) (HCC)    Depression    Heart murmur     Past Surgical History:  Procedure Laterality Date   ABDOMINAL HYSTERECTOMY      Prior to Admission medications   Medication Sig Start Date End Date Taking? Authorizing Provider  D-5000 125 MCG (5000 UT) TABS Take 1 tablet by mouth once a week. 10/18/22  Yes [provider]  fluticasone (FLONASE) 50 MCG/ACT nasal spray Place 1 spray into both nostrils daily. 10/18/22  Yes [provider]  lurasidone (LATUDA) 40 MG TABS tablet Take 40 mg by mouth daily. 01/31/23  Yes [provider]  albuterol (PROVENTIL HFA;VENTOLIN HFA) 108 (90 Base) MCG/ACT inhaler Inhale 2 puffs into the lungs every 6 (six) hours as needed for wheezing or shortness of breath.    [provider]  Budeson-Glycopyrrol-Formoterol (BREZTRI AEROSPHERE) 160-9-4.8 MCG/ACT AERO Inhale 2 puffs into the lungs in the morning and at bedtime. 02/01/23   Nyoka Cowden, MD  budesonide-formoterol (SYMBICORT) 80-4.5 MCG/ACT inhaler Take 2 puffs first thing in am and then another 2 puffs about 12 hours later. 02/01/23   Nyoka Cowden, MD  diclofenac Sodium (VOLTAREN) 1 % GEL Apply 2 g topically as needed. 10/18/22   [provider]  ibuprofen (ADVIL,MOTRIN) 800 MG tablet Take 1 tablet (800 mg total) by mouth every 8 (eight) hours as needed. 01/10/18   Emily Filbert, MD  [facility] 17 GM/SCOOP powder 1 Scoopful By Mouth 3 Times Daily 10/18/22   [provider]  ondansetron (ZOFRAN) 4 MG  tablet Take 1 tablet (4 mg total) by mouth every 8 (eight) hours as needed for nausea or vomiting. 08/20/23 11/18/23  Celso Amy, PA-C    Allergies as of 08/20/2023 - Review Complete 08/20/2023  Allergen Reaction Noted   Shellfish allergy Anaphylaxis 03/14/2016   Amoxicillin Hives 03/14/2016    Family History  Problem Relation Age of Onset   Breast cancer Sister     Social History   Socioeconomic History   Marital status: Single    Spouse name: Not on file   Number of children: Not on file   Years of education: Not on file   Highest education level: Not on file  Occupational History   Not on file  Tobacco Use   Smoking status: Every Day    Current packs/day: 2.50    Average packs/day: 2.5 packs/day for 35.0 years (87.5 ttl pk-yrs)    Types: Cigarettes   Smokeless tobacco: Never   Tobacco comments:    8 cigarettes a day. khj  Vaping Use   Vaping status: Former  Substance and Sexual Activity   Alcohol use: Yes    Comment: weekly   Drug use: No   Sexual activity: Never  Other Topics Concern   Not on file  Social History Narrative   Not on file   Social Determinants of Health   Financial Resource Strain: Not on file  Food Insecurity: Not on file  Transportation Needs: Not on file  Physical Activity: Not on file  Stress: Not on file  Social Connections: Not on file  Intimate Partner Violence: Not on file    Review of Systems: See HPI, otherwise negative ROS  Physical Exam: BP 102/81   Pulse 79   Temp (!) 96.5 F (35.8 C) (Temporal)   Resp 16   Ht 5\' 3"  (1.6 m)   Wt 45.2 kg   SpO2 96%   BMI 17.64 kg/m  General:   Alert,  pleasant and cooperative in NAD Head:  Normocephalic and atraumatic. Neck:  Supple; no masses or thyromegaly. Lungs:  Clear throughout to auscultation.    Heart:  Regular rate and rhythm. Abdomen:  Soft, nontender and nondistended. Normal bowel sounds, without guarding, and without rebound.   Neurologic:  Alert and  oriented x4;   grossly normal neurologically.  Impression/Plan: Angel Juarez is here for an endoscopy and colonoscopy to be performed for nausea, weight loss, colon cancer screening  Risks, benefits, limitations, and alternatives regarding  endoscopy and colonoscopy have been reviewed with the patient.  Questions have been answered.  All parties agreeable.   Lannette Donath, MD  09/26/2023, 10:46 AM

## 2023-09-26 NOTE — Anesthesia Postprocedure Evaluation (Signed)
Anesthesia Post Note  Patient: Angel Juarez  Procedure(s) Performed: ESOPHAGOGASTRODUODENOSCOPY (EGD) WITH PROPOFOL COLONOSCOPY WITH PROPOFOL BIOPSY  Patient location during evaluation: PACU Anesthesia Type: General Level of consciousness: awake Pain management: satisfactory to patient Vital Signs Assessment: post-procedure vital signs reviewed and stable Respiratory status: spontaneous breathing Cardiovascular status: stable Anesthetic complications: no   No notable events documented.   Last Vitals:  Vitals:   09/26/23 1139 09/26/23 1149  BP: 111/79   Pulse: 83 76  Resp: 16   Temp:    SpO2: 100% 100%    Last Pain:  Vitals:   09/26/23 1149  TempSrc:   PainSc: 0-No pain                 VAN STAVEREN,Nazariah Cadet

## 2023-09-26 NOTE — Op Note (Signed)
[facility] Gastroenterology Patient Name: Angel Juarez Procedure Date: 09/26/2023 10:54 AM MRN: [id number] Account #: [id number] Date of Birth: 1971/10/29 Admit Type: Outpatient Age: 52 Room: [facility] ENDO ROOM 3 Gender: Female Note Status: Finalized Instrument Name: Upper Endoscope [id number] Procedure:             Upper GI endoscopy Indications:           Weight loss Providers:             Toney Reil MD, MD Referring MD:          No Local Md, MD (Referring MD) Medicines:             General Anesthesia Complications:         No immediate complications. Estimated blood loss: None. Procedure:             Pre-Anesthesia Assessment:                        - Prior to the procedure, a History and Physical was                         performed, and patient medications and allergies were                         reviewed. The patient is competent. The risks and                         benefits of the procedure and the sedation options and                         risks were discussed with the patient. All questions                         were answered and informed consent was obtained.                         Patient identification and proposed procedure were                         verified by the physician, the nurse, the                         anesthesiologist, the anesthetist and the technician                         in the pre-procedure area in the procedure room in the                         endoscopy suite. Mental Status Examination: alert and                         oriented. Airway Examination: normal oropharyngeal                         airway and neck mobility. Respiratory Examination:                         clear to auscultation. CV Examination: normal.  Prophylactic Antibiotics: The patient does not require                         prophylactic antibiotics. Prior Anticoagulants: The                         patient has taken no  anticoagulant or antiplatelet                         agents. ASA Grade Assessment: III - A patient with                         severe systemic disease. After reviewing the risks and                         benefits, the patient was deemed in satisfactory                         condition to undergo the procedure. The anesthesia                         plan was to use general anesthesia. Immediately prior                         to administration of medications, the patient was                         re-assessed for adequacy to receive sedatives. The                         heart rate, respiratory rate, oxygen saturations,                         blood pressure, adequacy of pulmonary ventilation, and                         response to care were monitored throughout the                         procedure. The physical status of the patient was                         re-assessed after the procedure.                        After obtaining informed consent, the endoscope was                         passed under direct vision. Throughout the procedure,                         the patient's blood pressure, pulse, and oxygen                         saturations were monitored continuously. The Endoscope                         was introduced through the mouth, and advanced to the  second part of duodenum. The upper GI endoscopy was                         accomplished without difficulty. The patient tolerated                         the procedure well. Findings:      One non-bleeding superficial duodenal ulcer with a clean ulcer base       (Forrest Class III) was found in the duodenal bulb. The lesion was 6 mm       in largest dimension.      The second portion of the duodenum was normal. Biopsies were taken with       a cold forceps for histology.      Multiple dispersed erosions with no bleeding and no stigmata of recent       bleeding were found in the gastric body.  Biopsies were taken with a cold       forceps for Helicobacter pylori testing.      The incisura and gastric antrum were normal. Biopsies were taken with a       cold forceps for Helicobacter pylori testing.      The cardia and gastric fundus were normal on retroflexion.      Esophagogastric landmarks were identified: the gastroesophageal junction       was found at 40 cm from the incisors.      The gastroesophageal junction and examined esophagus were normal. Impression:            - Non-bleeding duodenal ulcer with a clean ulcer base                         (Forrest Class III).                        - Normal second portion of the duodenum. Biopsied.                        - Erosive gastropathy with no bleeding and no stigmata                         of recent bleeding. Biopsied.                        - Normal incisura and antrum. Biopsied.                        - Esophagogastric landmarks identified.                        - Normal gastroesophageal junction and esophagus. Recommendation:        - Await pathology results.                        - Use Prilosec (omeprazole) 40 mg PO BID for 3 months.                        - Proceed with colonoscopy as scheduled                        See colonoscopy report Procedure Code(s):     ---  Professional ---                        [phone], Esophagogastroduodenoscopy, flexible,                         transoral; with biopsy, single or multiple Diagnosis Code(s):     --- Professional ---                        K26.9, Duodenal ulcer, unspecified as acute or                         chronic, without hemorrhage or perforation                        K31.89, Other diseases of stomach and duodenum                        R63.4, Abnormal weight loss CPT copyright 2022 American Medical Association. All rights reserved. The codes documented in this report are preliminary and upon coder review may  be revised to meet current compliance requirements. Dr.  Libby Maw Toney Reil MD, MD 09/26/2023 11:12:40 AM This report has been signed electronically. Number of Addenda: 0 Note Initiated On: 09/26/2023 10:54 AM Estimated Blood Loss:  Estimated blood loss: none.      [facility]

## 2023-09-26 NOTE — Op Note (Signed)
[facility] Gastroenterology Patient Name: Angel Juarez Procedure Date: 09/26/2023 10:53 AM MRN: [id number] Account #: [id number] Date of Birth: [birth year] Admit Type: Outpatient Age: 52 Room: [facility] ENDO ROOM 3 Gender: Female Note Status: Finalized Instrument Name: Peds Colonoscope [id number] Procedure:             Colonoscopy Indications:           Screening for colorectal malignant neoplasm Providers:             Toney Reil MD, MD Referring MD:          No Local Md, MD (Referring MD) Medicines:             General Anesthesia Complications:         No immediate complications. Estimated blood loss: None. Procedure:             Pre-Anesthesia Assessment:                        - Prior to the procedure, a History and Physical was                         performed, and patient medications and allergies were                         reviewed. The patient is competent. The risks and                         benefits of the procedure and the sedation options and                         risks were discussed with the patient. All questions                         were answered and informed consent was obtained.                         Patient identification and proposed procedure were                         verified by the physician, the nurse, the                         anesthesiologist, the anesthetist and the technician                         in the pre-procedure area in the procedure room in the                         endoscopy suite. Mental Status Examination: alert and                         oriented. Airway Examination: normal oropharyngeal                         airway and neck mobility. Respiratory Examination:                         clear to auscultation. CV Examination: normal.  Prophylactic Antibiotics: The patient does not require                         prophylactic antibiotics. Prior Anticoagulants: The                          patient has taken no anticoagulant or antiplatelet                         agents. ASA Grade Assessment: III - A patient with                         severe systemic disease. After reviewing the risks and                         benefits, the patient was deemed in satisfactory                         condition to undergo the procedure. The anesthesia                         plan was to use general anesthesia. Immediately prior                         to administration of medications, the patient was                         re-assessed for adequacy to receive sedatives. The                         heart rate, respiratory rate, oxygen saturations,                         blood pressure, adequacy of pulmonary ventilation, and                         response to care were monitored throughout the                         procedure. The physical status of the patient was                         re-assessed after the procedure.                        After obtaining informed consent, the colonoscope was                         passed under direct vision. Throughout the procedure,                         the patient's blood pressure, pulse, and oxygen                         saturations were monitored continuously. The                         Colonoscope was introduced through the anus and  advanced to the the terminal ileum, with                         identification of the appendiceal orifice and IC                         valve. The colonoscopy was performed without                         difficulty. The patient tolerated the procedure well.                         The quality of the bowel preparation was evaluated                         using the BBPS [facility] Bowel Preparation Scale) with                         scores of: Right Colon = 3, Transverse Colon = 3 and                         Left Colon = 3 (entire mucosa seen well with no                         residual  staining, small fragments of stool or opaque                         liquid). The total BBPS score equals 9. The terminal                         ileum, ileocecal valve, appendiceal orifice, and                         rectum were photographed. Findings:      The perianal and digital rectal examinations were normal. Pertinent       negatives include normal sphincter tone and no palpable rectal lesions.      The terminal ileum appeared normal.      The entire examined colon appeared normal.      The retroflexed view of the distal rectum and anal verge was normal and       showed no anal or rectal abnormalities. Impression:            - The examined portion of the ileum was normal.                        - The entire examined colon is normal.                        - The distal rectum and anal verge are normal on                         retroflexion view.                        - No specimens collected. Recommendation:        - Discharge patient to home (with escort).                        -  Resume previous diet today.                        - Continue present medications.                        - Repeat colonoscopy in 10 years for screening                         purposes. Procedure Code(s):     --- Professional ---                        Z6109, Colorectal cancer screening; colonoscopy on                         individual not meeting criteria for high risk Diagnosis Code(s):     --- Professional ---                        Z12.11, Encounter for screening for malignant neoplasm                         of colon CPT copyright 2022 American Medical Association. All rights reserved. The codes documented in this report are preliminary and upon coder review may  be revised to meet current compliance requirements. Dr. Libby Maw Toney Reil MD, MD 09/26/2023 11:27:45 AM This report has been signed electronically. Number of Addenda: 0 Note Initiated On: 09/26/2023 10:53 AM Scope  Withdrawal Time: 0 hours 5 minutes 50 seconds  Total Procedure Duration: 0 hours 10 minutes 37 seconds  Estimated Blood Loss:  Estimated blood loss: none.      [facility]

## 2023-09-26 NOTE — Anesthesia Procedure Notes (Signed)
Date/Time: 09/26/2023 10:58 AM  Performed by: Ginger Carne, CRNAPre-anesthesia Checklist: Patient identified, Emergency Drugs available, Suction available, Patient being monitored and Timeout performed Patient Re-evaluated:Patient Re-evaluated prior to induction Oxygen Delivery Method: Nasal cannula Preoxygenation: Pre-oxygenation with 100% oxygen Induction Type: IV induction

## 2023-09-27 ENCOUNTER — Encounter: Payer: Self-pay | Admitting: Gastroenterology

## 2023-09-27 LAB — SURGICAL PATHOLOGY

## 2023-09-30 ENCOUNTER — Telehealth: Payer: Self-pay

## 2023-09-30 NOTE — Telephone Encounter (Signed)
-----   Message from [facility] sent at 09/27/2023 12:04 PM EST ----- Please inform patient that the pathology results from upper endoscopy came back normal.  She has duodenal ulcer and therefore sent prescription for Prilosec 40 mg twice daily before meals for 3 months then once a day long-term She should avoid taking nonsteroidal medications such as ibuprofen, Advil, Aleve, BC powder, Goody powder on a daily basis  RV

## 2023-09-30 NOTE — Telephone Encounter (Signed)
Patient verbalized understanding of results  

## 2024-01-31 ENCOUNTER — Ambulatory Visit: Admitting: Podiatry

## 2024-02-11 ENCOUNTER — Ambulatory Visit: Admitting: Podiatry

## 2024-02-11 ENCOUNTER — Encounter: Payer: Self-pay | Admitting: Podiatry

## 2024-02-11 ENCOUNTER — Ambulatory Visit (INDEPENDENT_AMBULATORY_CARE_PROVIDER_SITE_OTHER): Payer: MEDICAID

## 2024-02-11 DIAGNOSIS — M722 Plantar fascial fibromatosis: Secondary | ICD-10-CM | POA: Diagnosis not present

## 2024-02-11 MED ORDER — BETAMETHASONE SOD PHOS & ACET 6 (3-3) MG/ML IJ SUSP
3.0000 mg | Freq: Once | INTRAMUSCULAR | Status: AC
  Administered 2024-02-11: 3 mg via INTRA_ARTICULAR

## 2024-02-11 MED ORDER — METHYLPREDNISOLONE 4 MG PO TBPK: ORAL_TABLET | ORAL | 0 refills | Status: AC

## 2024-02-11 MED ORDER — MELOXICAM 15 MG PO TABS: 15.0000 mg | ORAL_TABLET | Freq: Every day | ORAL | 1 refills | Status: AC

## 2024-02-11 NOTE — Progress Notes (Signed)
   Chief Complaint  Patient presents with   Foot Pain    "I can't walk on my feet.  My feet are killing me." N - feet hurt L - heel plantar bilateral D - January O - gradually worse C - sharp pain A - standing  T - brought inserts, I don't know if it's the right kind    Subjective: 53 y.o. female presenting today for evaluation of pain and tenderness associated to the right plantar heel.  Onset for several months.  She says he has a history of plantar fasciitis.  She recently purchased some arch supports but is not begin wearing them yet.   Past Medical History:  Diagnosis Date   Asthma    COPD (chronic obstructive pulmonary disease) (HCC)    Depression    Heart murmur      Objective: Physical Exam General: The patient is alert and oriented x3 in no acute distress.  Dermatology: Skin is warm, dry and supple bilateral lower extremities. Negative for open lesions or macerations bilateral.   Vascular: Dorsalis Pedis and Posterior Tibial pulses palpable bilateral.  Capillary fill time is immediate to all digits.  Neurological: Grossly intact via light touch  Musculoskeletal: Tenderness to palpation to the plantar aspect of the right heel along the plantar fascia. All other joints range of motion within normal limits bilateral. Strength 5/5 in all groups bilateral.   Radiographic exam B/L foot 02/11/2024: Normal osseous mineralization. Joint spaces preserved. No fracture/dislocation/boney destruction. No other soft tissue abnormalities or radiopaque foreign bodies.  Impression: Negative  Assessment: 1. Plantar fasciitis right  Plan of Care:  -Patient evaluated.  X-rays reviewed -Injection of 0.5 cc Celestone Soluspan injected in the plantar fascia right -Prescription for Medrol Dosepak -Prescription for meloxicam 15 mg daily after completion of the Dosepak -Patient recently purchased a pair of OTC arch supports.  Recommend wearing daily with good supportive tennis shoes.   Advise against going barefoot -Return to clinic 4 weeks   Felecia Shelling, DPM [facility]  Dr. Felecia Shelling, DPM    [address] [place], [place] [address]                Office [phone]  Fax [phone]

## 2024-03-13 ENCOUNTER — Ambulatory Visit: Admitting: Podiatry

## 2024-03-27 ENCOUNTER — Encounter: Payer: Self-pay | Admitting: Podiatry

## 2024-03-27 ENCOUNTER — Ambulatory Visit: Payer: MEDICAID | Admitting: Podiatry

## 2024-03-27 DIAGNOSIS — M722 Plantar fascial fibromatosis: Secondary | ICD-10-CM

## 2024-03-27 NOTE — Progress Notes (Signed)
   Chief Complaint  Patient presents with   Plantar Fasciitis    "It's doing great as long as I keep the arch supports in my shoes.  As soon as I take them out, my foot kills me."    Subjective: 53 y.o. female presenting today for plantar fasciitisFollow-up evaluation of right heel.  Overall she is doing significantly better.  She no longer has any pain or tenderness to the heel  Past Medical History:  Diagnosis Date   Asthma    COPD (chronic obstructive pulmonary disease) (HCC)    Depression    Heart murmur    Past Surgical History:  Procedure Laterality Date   ABDOMINAL HYSTERECTOMY     BIOPSY  09/26/2023   Procedure: BIOPSY;  Surgeon: Selena Daily, MD;  Location: [facility] ENDOSCOPY;  Service: Gastroenterology;;   COLONOSCOPY WITH PROPOFOL  N/A 09/26/2023   Procedure: COLONOSCOPY WITH PROPOFOL ;  Surgeon: Selena Daily, MD;  Location: [facility] ENDOSCOPY;  Service: Gastroenterology;  Laterality: N/A;   ESOPHAGOGASTRODUODENOSCOPY (EGD) WITH PROPOFOL  N/A 09/26/2023   Procedure: ESOPHAGOGASTRODUODENOSCOPY (EGD) WITH PROPOFOL ;  Surgeon: Selena Daily, MD;  Location: [facility] ENDOSCOPY;  Service: Gastroenterology;  Laterality: N/A;  PATIENT STATES SHE RIDES ACTA   Allergies  Allergen Reactions   Shellfish Allergy Anaphylaxis   Amoxicillin Hives     Objective: Physical Exam General: The patient is alert and oriented x3 in no acute distress.  Dermatology: Skin is warm, dry and supple bilateral lower extremities. Negative for open lesions or macerations bilateral.   Vascular: Dorsalis Pedis and Posterior Tibial pulses palpable bilateral.  Capillary fill time is immediate to all digits.  Neurological: Grossly intact via light touch  Musculoskeletal: No appreciable tenderness to palpation to the plantar aspect of the right heel along the plantar fascia. All other joints range of motion within normal limits bilateral. Strength 5/5 in all groups bilateral.   Radiographic exam  B/L foot 02/11/2024: Normal osseous mineralization. Joint spaces preserved. No fracture/dislocation/boney destruction. No other soft tissue abnormalities or radiopaque foreign bodies.  Impression: Negative  Assessment: 1. Plantar fasciitis right; resolved  Plan of Care:  -Patient evaluated.   -Discussed meloxicam  15 mg daily.  She apparently does have a history of stomach ulcer -Recommend good supportive tennis shoes. Advise against going barefoot -OTC prefabricated power step insoles dispensed -Return to clinic PRN  Dot Gazella, DPM [facility]  Dr. Dot Gazella, DPM    [address] [place], [place] [address]                Office [phone]  Fax [phone]

## 2024-08-05 ENCOUNTER — Other Ambulatory Visit: Payer: Self-pay | Admitting: Pediatrics

## 2024-08-05 DIAGNOSIS — Z780 Asymptomatic menopausal state: Secondary | ICD-10-CM
